# Patient Record
Sex: Male | Born: 1965
Health system: Southern US, Community
[De-identification: ages and names within clinical notes are randomized; demographics above are authoritative.]

## PROBLEM LIST (undated history)

## (undated) DIAGNOSIS — M25522 Pain in left elbow: Secondary | ICD-10-CM

## (undated) DIAGNOSIS — R140 Abdominal distension (gaseous): Secondary | ICD-10-CM

## (undated) DIAGNOSIS — K4090 Unilateral inguinal hernia, without obstruction or gangrene, not specified as recurrent: Secondary | ICD-10-CM

## (undated) DIAGNOSIS — R21 Rash and other nonspecific skin eruption: Secondary | ICD-10-CM

## (undated) DIAGNOSIS — F329 Major depressive disorder, single episode, unspecified: Secondary | ICD-10-CM

## (undated) DIAGNOSIS — B370 Candidal stomatitis: Secondary | ICD-10-CM

## (undated) DIAGNOSIS — M25561 Pain in right knee: Secondary | ICD-10-CM

## (undated) DIAGNOSIS — B20 Human immunodeficiency virus [HIV] disease: Secondary | ICD-10-CM

## (undated) DIAGNOSIS — Z21 Asymptomatic human immunodeficiency virus [HIV] infection status: Secondary | ICD-10-CM

## (undated) DIAGNOSIS — R1310 Dysphagia, unspecified: Secondary | ICD-10-CM

## (undated) DIAGNOSIS — B1920 Unspecified viral hepatitis C without hepatic coma: Secondary | ICD-10-CM

## (undated) DIAGNOSIS — N5089 Other specified disorders of the male genital organs: Secondary | ICD-10-CM

## (undated) HISTORY — DX: Abdominal distension (gaseous): R14.0

## (undated) HISTORY — DX: Unilateral inguinal hernia, without obstruction or gangrene, not specified as recurrent: K40.90

## (undated) HISTORY — DX: Human immunodeficiency virus (HIV) disease: B20

## (undated) HISTORY — DX: Candidal stomatitis: B37.0

## (undated) HISTORY — DX: Major depressive disorder, single episode, unspecified: F32.9

## (undated) HISTORY — DX: Rash and other nonspecific skin eruption: R21

## (undated) HISTORY — DX: Pain in right knee: M25.561

## (undated) HISTORY — DX: Dysphagia, unspecified: R13.10

---
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
## 2011-02-01 DIAGNOSIS — F1911 Other psychoactive substance abuse, in remission: Secondary | ICD-10-CM | POA: Insufficient documentation

## 2012-11-28 DIAGNOSIS — K602 Anal fissure, unspecified: Secondary | ICD-10-CM | POA: Insufficient documentation

## 2013-12-09 DIAGNOSIS — B20 Human immunodeficiency virus [HIV] disease: Secondary | ICD-10-CM | POA: Insufficient documentation

## 2014-05-01 DIAGNOSIS — Z8619 Personal history of other infectious and parasitic diseases: Secondary | ICD-10-CM | POA: Insufficient documentation

## 2014-10-29 DIAGNOSIS — R768 Other specified abnormal immunological findings in serum: Secondary | ICD-10-CM | POA: Insufficient documentation

## 2014-11-12 DIAGNOSIS — A563 Chlamydial infection of anus and rectum: Secondary | ICD-10-CM | POA: Insufficient documentation

## 2014-12-14 DIAGNOSIS — W540XXA Bitten by dog, initial encounter: Secondary | ICD-10-CM | POA: Insufficient documentation

## 2014-12-14 DIAGNOSIS — S81852A Open bite, left lower leg, initial encounter: Secondary | ICD-10-CM | POA: Insufficient documentation

## 2015-11-23 ENCOUNTER — Other Ambulatory Visit (INDEPENDENT_AMBULATORY_CARE_PROVIDER_SITE_OTHER): Payer: Self-pay

## 2015-11-23 DIAGNOSIS — Z79899 Other long term (current) drug therapy: Secondary | ICD-10-CM

## 2015-11-23 DIAGNOSIS — Z21 Asymptomatic human immunodeficiency virus [HIV] infection status: Secondary | ICD-10-CM

## 2015-11-23 DIAGNOSIS — Z113 Encounter for screening for infections with a predominantly sexual mode of transmission: Secondary | ICD-10-CM

## 2015-11-23 DIAGNOSIS — B20 Human immunodeficiency virus [HIV] disease: Secondary | ICD-10-CM

## 2015-11-23 LAB — CBC WITH DIFFERENTIAL/PLATELET
BASOS ABS: 0 {cells}/uL (ref 0–200)
BASOS PCT: 0 %
EOS PCT: 4 %
Eosinophils Absolute: 156 cells/uL (ref 15–500)
HCT: 41.3 % (ref 38.5–50.0)
Hemoglobin: 13.9 g/dL (ref 13.2–17.1)
Lymphocytes Relative: 29 %
Lymphs Abs: 1131 cells/uL (ref 850–3900)
MCH: 29.5 pg (ref 27.0–33.0)
MCHC: 33.7 g/dL (ref 32.0–36.0)
MCV: 87.7 fL (ref 80.0–100.0)
MONOS PCT: 19 %
MPV: 10.1 fL (ref 7.5–12.5)
Monocytes Absolute: 741 cells/uL (ref 200–950)
NEUTROS ABS: 1872 {cells}/uL (ref 1500–7800)
Neutrophils Relative %: 48 %
PLATELETS: 137 10*3/uL — AB (ref 140–400)
RBC: 4.71 MIL/uL (ref 4.20–5.80)
RDW: 13.9 % (ref 11.0–15.0)
WBC: 3.9 10*3/uL (ref 3.8–10.8)

## 2015-11-23 LAB — LIPID PANEL
CHOLESTEROL: 159 mg/dL (ref 125–200)
HDL: 51 mg/dL (ref >=40)
LDL Cholesterol: 75 mg/dL (ref <130)
Total CHOL/HDL Ratio: 3.1 Ratio (ref <=5.0)
Triglycerides: 165 mg/dL — ABNORMAL HIGH (ref <150)
VLDL: 33 mg/dL — ABNORMAL HIGH (ref <30)

## 2015-11-23 LAB — COMPLETE METABOLIC PANEL WITH GFR
ALT: 32 U/L (ref 9–46)
AST: 36 U/L — AB (ref 10–35)
Albumin: 3.5 g/dL — ABNORMAL LOW (ref 3.6–5.1)
Alkaline Phosphatase: 71 U/L (ref 40–115)
BILIRUBIN TOTAL: 0.4 mg/dL (ref 0.2–1.2)
BUN: 15 mg/dL (ref 7–25)
CO2: 24 mmol/L (ref 20–31)
Calcium: 9.2 mg/dL (ref 8.6–10.3)
Chloride: 106 mmol/L (ref 98–110)
Creat: 0.86 mg/dL (ref 0.70–1.33)
GFR, Est African American: >89 mL/min (ref >=60)
GLUCOSE: 67 mg/dL (ref 65–99)
POTASSIUM: 4 mmol/L (ref 3.5–5.3)
SODIUM: 139 mmol/L (ref 135–146)
TOTAL PROTEIN: 8 g/dL (ref 6.1–8.1)

## 2015-11-24 LAB — RPR TITER: RPR Titer: 1:1 {titer}

## 2015-11-24 LAB — URINALYSIS
BILIRUBIN URINE: NEGATIVE
GLUCOSE, UA: NEGATIVE
HGB URINE DIPSTICK: NEGATIVE
Ketones, ur: NEGATIVE
LEUKOCYTES UA: NEGATIVE
Nitrite: NEGATIVE
PROTEIN: NEGATIVE
Specific Gravity, Urine: 1.012 (ref 1.001–1.035)
pH: 7 (ref 5.0–8.0)

## 2015-11-24 LAB — HEPATITIS C ANTIBODY: HCV Ab: REACTIVE — AB

## 2015-11-24 LAB — RPR: RPR Ser Ql: REACTIVE — AB

## 2015-11-24 LAB — QUANTIFERON TB GOLD ASSAY (BLOOD)
Interferon Gamma Release Assay: NEGATIVE
MITOGEN-NIL SO: 6.07 [IU]/mL
Quantiferon Nil Value: 0.07 IU/mL

## 2015-11-24 LAB — HIV-1 RNA ULTRAQUANT REFLEX TO GENTYP+
HIV 1 RNA Quant: 168820 copies/mL — ABNORMAL HIGH (ref <20)
HIV-1 RNA QUANT, LOG: 5.23 {Log_copies}/mL — AB (ref <1.30)

## 2015-11-24 LAB — HEPATITIS B SURFACE ANTIGEN: Hepatitis B Surface Ag: NEGATIVE

## 2015-11-24 LAB — URINE CYTOLOGY ANCILLARY ONLY
CHLAMYDIA, DNA PROBE: NEGATIVE
NEISSERIA GONORRHEA: NEGATIVE

## 2015-11-24 LAB — HEPATITIS A ANTIBODY, TOTAL: HEP A TOTAL AB: NONREACTIVE

## 2015-11-24 LAB — HEPATITIS B CORE ANTIBODY, TOTAL: HEP B C TOTAL AB: REACTIVE — AB

## 2015-11-24 LAB — HEPATITIS C RNA QUANTITATIVE
HCV Quantitative Log: 7.1 {Log} — ABNORMAL HIGH (ref <1.18)
HCV Quantitative: 12455278 IU/mL — ABNORMAL HIGH (ref <15)

## 2015-11-24 LAB — HEPATITIS B SURFACE ANTIBODY,QUALITATIVE

## 2015-11-24 LAB — FLUORESCENT TREPONEMAL AB(FTA)-IGG-BLD: FLUORESCENT TREPONEMAL ABS: REACTIVE — AB

## 2015-11-24 LAB — T-HELPER CELL (CD4) - (RCID CLINIC ONLY)
CD4 T CELL ABS: 80 /uL — AB (ref 400–2700)
CD4 T CELL HELPER: 7 % — AB (ref 33–55)

## 2015-11-27 LAB — HLA B*5701: HLA-B*5701 w/rflx HLA-B High: NEGATIVE

## 2015-12-03 ENCOUNTER — Encounter: Payer: Self-pay | Admitting: Infectious Disease

## 2015-12-03 LAB — HIV-1 GENOTYPR PLUS

## 2015-12-07 ENCOUNTER — Ambulatory Visit: Payer: Self-pay | Admitting: Infectious Disease

## 2015-12-28 ENCOUNTER — Encounter: Payer: Self-pay | Admitting: Infectious Disease

## 2016-01-11 ENCOUNTER — Ambulatory Visit: Payer: Self-pay | Admitting: Infectious Disease

## 2016-01-18 ENCOUNTER — Encounter: Payer: Self-pay | Admitting: Infectious Disease

## 2016-01-18 ENCOUNTER — Ambulatory Visit: Payer: Self-pay | Admitting: *Deleted

## 2016-01-18 ENCOUNTER — Ambulatory Visit (INDEPENDENT_AMBULATORY_CARE_PROVIDER_SITE_OTHER): Payer: Self-pay | Admitting: Infectious Disease

## 2016-01-18 VITALS — Temp 97.9°F | Ht 68.0 in | Wt 151.0 lb

## 2016-01-18 DIAGNOSIS — A64 Unspecified sexually transmitted disease: Secondary | ICD-10-CM

## 2016-01-18 DIAGNOSIS — S81852A Open bite, left lower leg, initial encounter: Secondary | ICD-10-CM

## 2016-01-18 DIAGNOSIS — F32A Depression, unspecified: Secondary | ICD-10-CM

## 2016-01-18 DIAGNOSIS — Z23 Encounter for immunization: Secondary | ICD-10-CM

## 2016-01-18 DIAGNOSIS — W540XXA Bitten by dog, initial encounter: Secondary | ICD-10-CM

## 2016-01-18 DIAGNOSIS — K4091 Unilateral inguinal hernia, without obstruction or gangrene, recurrent: Secondary | ICD-10-CM

## 2016-01-18 DIAGNOSIS — B182 Chronic viral hepatitis C: Secondary | ICD-10-CM

## 2016-01-18 DIAGNOSIS — F331 Major depressive disorder, recurrent, moderate: Secondary | ICD-10-CM

## 2016-01-18 DIAGNOSIS — B20 Human immunodeficiency virus [HIV] disease: Secondary | ICD-10-CM

## 2016-01-18 DIAGNOSIS — F411 Generalized anxiety disorder: Secondary | ICD-10-CM

## 2016-01-18 DIAGNOSIS — F329 Major depressive disorder, single episode, unspecified: Secondary | ICD-10-CM | POA: Insufficient documentation

## 2016-01-18 DIAGNOSIS — R768 Other specified abnormal immunological findings in serum: Secondary | ICD-10-CM

## 2016-01-18 HISTORY — DX: Depression, unspecified: F32.A

## 2016-01-18 HISTORY — DX: Human immunodeficiency virus (HIV) disease: B20

## 2016-01-18 MED ORDER — CEFTRIAXONE SODIUM 250 MG IJ SOLR: 250.0000 mg | Freq: Once | INTRAMUSCULAR | 0 refills | Status: DC

## 2016-01-18 MED ORDER — DARUNAVIR-COBICISTAT 800-150 MG PO TABS: 1.0000 | ORAL_TABLET | Freq: Every day | ORAL | 11 refills | Status: DC

## 2016-01-18 MED ORDER — EMTRICITABINE-TENOFOVIR AF 200-25 MG PO TABS: 1.0000 | ORAL_TABLET | Freq: Every day | ORAL | 11 refills | Status: DC

## 2016-01-18 MED ORDER — AZITHROMYCIN 250 MG PO TABS
1000.0000 mg | ORAL_TABLET | Freq: Every day | ORAL | Status: DC
  Administered 2016-01-18: 1000 mg via ORAL

## 2016-01-18 MED ORDER — CEFTRIAXONE SODIUM 250 MG IJ SOLR
250.0000 mg | Freq: Once | INTRAMUSCULAR | Status: AC
  Administered 2016-01-18: 250 mg via INTRAMUSCULAR

## 2016-01-18 MED ORDER — SULFAMETHOXAZOLE-TRIMETHOPRIM 800-160 MG PO TABS: 1.0000 | ORAL_TABLET | Freq: Two times a day (BID) | ORAL | 11 refills | Status: DC

## 2016-01-18 NOTE — BH Specialist Note (Signed)
Counselor met with Jason [name] today in the exam room for a warm hand off.  Patient was oriented times four with nervous and fidgety affect.  Counselor introduce self and the counseling services available for him here at [place].  Patient was very talkative and had no problem sharing freely and openly.  Patient communicated that he was not doing so well and hadn't been since his partner died of cancer a couple of years ago.  Patient also shared that he does not have good family support.  Counselor provided support and encouragement for patient.  Patient indicated that he would like to make an appointment and better process through his loss and grief.  An appointment was made for the 17th of November.   Rolena Infante, MA, LPC [facility]

## 2016-01-18 NOTE — Patient Instructions (Signed)
Please meet with Pharmacy in next 2 weeks

## 2016-01-18 NOTE — Progress Notes (Signed)
Subjective:   Chief complaint: establish care for HIV, HCV, depression   Patient ID: Jason [name], male    DOB: [birth year], 50 y.o.   MRN: [id number]  HPI  50 year old Caucasian man with HIV, and HCV coinfection, Initially cared for in [place] at [facility]. [place] has some [place] [facility]. Unfortunately while there he has not been very adherent to his antiretroviral regimen of Prezista NORVIR and Truvada. He is off medicines now for several months and decided to change care to [facility]. Most recent viral load with is over 180,000 and his CD4 count is 80.  He is willing to go back on a protease inhibitor based regimen and were going to start him back on PREZCOBIX with DESCOVY and use Bactrim for PCP prophylaxis.  He has some significant depressive symptoms related to moving to the area where he has less support that he had a [place].  He also lost his job last year was bitten by a dog adverse other stressors that cause depression and cause him not to be as proactive about his health and adherent to his antiretrovirals.  I had him meet with Jason [name] today.   Past Medical History:  Diagnosis Date  . AIDS ([place]) 01/18/2016  . Depression 01/18/2016    No past surgical history on file.  No family history on file.    Social History   Social History  . Marital status: Single    Spouse name: N/A  . Number of children: N/A  . Years of education: N/A   Social History Main Topics  . Smoking status: Never Smoker  . Smokeless tobacco: Never Used  . Alcohol use Yes     Comment: rare  . Drug use:     Types: Marijuana     Comment: regular use  . Sexual activity: Yes    Partners: Male     Comment: pt. given condoms   Other Topics Concern  . None   Social History Narrative  . None    Allergies  Allergen Reactions  . Codeine Itching     Current Outpatient Prescriptions:  .   darunavir-cobicistat (PREZCOBIX) 800-150 MG tablet, Take 1 tablet by mouth daily., Disp: 30 tablet, Rfl: 11 .  emtricitabine-tenofovir AF (DESCOVY) 200-25 MG tablet, Take 1 tablet by mouth daily., Disp: 30 tablet, Rfl: 11 .  sulfamethoxazole-trimethoprim (BACTRIM DS,SEPTRA DS) 800-160 MG tablet, Take 1 tablet by mouth 2 (two) times daily., Disp: 30 tablet, Rfl: 11   Review of Systems  Constitutional: Negative for chills and fever.  HENT: Negative for congestion and sore throat.   Eyes: Negative for photophobia.  Respiratory: Negative for cough, shortness of breath and wheezing.   Cardiovascular: Negative for chest pain, palpitations and leg swelling.  Gastrointestinal: Negative for abdominal pain, blood in stool, constipation, diarrhea, nausea and vomiting.  Genitourinary: Negative for dysuria, flank pain and hematuria.  Musculoskeletal: Negative for back pain and myalgias.  Skin: Negative for rash.  Neurological: Negative for dizziness, weakness and headaches.  Hematological: Does not bruise/bleed easily.  Psychiatric/Behavioral: Positive for decreased concentration and dysphoric mood. Negative for self-injury, sleep disturbance and suicidal ideas. The patient is nervous/anxious.        Objective:   Physical Exam  Constitutional: He is oriented to person, place, and time. He appears well-developed and well-nourished. No distress.  HENT:  Head: Normocephalic and atraumatic.  Mouth/Throat: No oropharyngeal exudate.  Eyes: Conjunctivae  and EOM are normal. No scleral icterus.  Neck: Normal range of motion. Neck supple.  Cardiovascular: Normal rate and regular rhythm.   Pulmonary/Chest: Effort normal. No respiratory distress. He has no wheezes.  Abdominal: He exhibits no distension.  Musculoskeletal: He exhibits no edema or tenderness.  Neurological: He is alert and oriented to person, place, and time. He exhibits normal muscle tone. Coordination normal.  Skin: Skin is warm and dry. No  rash noted. He is not diaphoretic. No erythema. No pallor.  Psychiatric: His behavior is normal. Judgment and thought content normal. His mood appears anxious. He exhibits a depressed mood.          Assessment & Plan:   HIV/AIDS: He is approved for ADAP. Start PREZCOBIX and DESCOVY and start Bactrim for PCP prophylaxis bring back to see Pharmacy in 2 weeks, repeat labs in 4 weeks and visit with me in 6 weeks  Depression: met with Jason [name]:  Consider SSRI  Chronic hepatitis C without hepatic coma: see what labs were done at [facility] first and get his HIV controlled before proceeding  I spent greater than 60 minutes with the patient including greater than 50% of time in face to face counsel of the patient re his HIV, his new ARV regimen, his depression, his HCV and in coordination of his care.

## 2016-01-28 ENCOUNTER — Encounter: Payer: Self-pay | Admitting: *Deleted

## 2016-02-05 ENCOUNTER — Ambulatory Visit: Payer: Self-pay

## 2016-02-12 ENCOUNTER — Other Ambulatory Visit (INDEPENDENT_AMBULATORY_CARE_PROVIDER_SITE_OTHER): Payer: Self-pay

## 2016-02-12 ENCOUNTER — Ambulatory Visit: Payer: Self-pay | Admitting: *Deleted

## 2016-02-12 DIAGNOSIS — B20 Human immunodeficiency virus [HIV] disease: Secondary | ICD-10-CM

## 2016-02-12 DIAGNOSIS — F329 Major depressive disorder, single episode, unspecified: Secondary | ICD-10-CM

## 2016-02-12 DIAGNOSIS — Z23 Encounter for immunization: Secondary | ICD-10-CM

## 2016-02-12 DIAGNOSIS — F32A Depression, unspecified: Secondary | ICD-10-CM

## 2016-02-12 LAB — CBC WITH DIFFERENTIAL/PLATELET
BASOS ABS: 32 {cells}/uL (ref 0–200)
Basophils Relative: 1 %
EOS ABS: 160 {cells}/uL (ref 15–500)
Eosinophils Relative: 5 %
HEMATOCRIT: 43.2 % (ref 38.5–50.0)
HEMOGLOBIN: 14.7 g/dL (ref 13.2–17.1)
LYMPHS ABS: 1152 {cells}/uL (ref 850–3900)
Lymphocytes Relative: 36 %
MCH: 29.1 pg (ref 27.0–33.0)
MCHC: 34 g/dL (ref 32.0–36.0)
MCV: 85.5 fL (ref 80.0–100.0)
MONO ABS: 672 {cells}/uL (ref 200–950)
MPV: 11.1 fL (ref 7.5–12.5)
Monocytes Relative: 21 %
NEUTROS ABS: 1184 {cells}/uL — AB (ref 1500–7800)
NEUTROS PCT: 37 %
Platelets: 65 10*3/uL — ABNORMAL LOW (ref 140–400)
RBC: 5.05 MIL/uL (ref 4.20–5.80)
RDW: 14 % (ref 11.0–15.0)
WBC: 3.2 10*3/uL — ABNORMAL LOW (ref 3.8–10.8)

## 2016-02-12 LAB — COMPLETE METABOLIC PANEL WITH GFR
ALBUMIN: 3.8 g/dL (ref 3.6–5.1)
ALK PHOS: 76 U/L (ref 40–115)
ALT: 33 U/L (ref 9–46)
AST: 41 U/L — ABNORMAL HIGH (ref 10–35)
BILIRUBIN TOTAL: 0.7 mg/dL (ref 0.2–1.2)
BUN: 16 mg/dL (ref 7–25)
CALCIUM: 8.8 mg/dL (ref 8.6–10.3)
CO2: 22 mmol/L (ref 20–31)
Chloride: 104 mmol/L (ref 98–110)
Creat: 0.91 mg/dL (ref 0.70–1.33)
GFR, Est African American: >89 mL/min (ref >=60)
GLUCOSE: 86 mg/dL (ref 65–99)
POTASSIUM: 4.3 mmol/L (ref 3.5–5.3)
SODIUM: 133 mmol/L — AB (ref 135–146)
TOTAL PROTEIN: 8.3 g/dL — AB (ref 6.1–8.1)

## 2016-02-12 LAB — T-HELPER CELL (CD4) - (RCID CLINIC ONLY)
CD4 T CELL HELPER: 13 % — AB (ref 33–55)
CD4 T Cell Abs: 150 /uL — ABNORMAL LOW (ref 400–2700)

## 2016-02-12 NOTE — Progress Notes (Signed)
Jason [name] showed on time for his scheduled appointment with counselor today.  Patient was oriented times four but with nervous affect.  Patient bounced his legs throughout session and was restless in his seat. Patient was alert and talkative.  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 Counselor provided support and encouragement.  Counselor used reality therapy with patient to better cope with his unwise decision and the ramifications since.  Counselor educated patient that it would be recommended that he start with truly acknowledging his unwise decision, forgiving himself, and concentrating on the future.  Patient was able to follow these suggested steps and shared how he feels that he has good experiences to share with young people about the repercussion os doing drugs. Counselor encouraged patient to continue to attend counseling to further process better coping strategies for moving forward and healing. Patient made another appointment for two weeks out.   Jenel LucksJodi Armen Waring, MA, LPC [facility]

## 2016-02-19 ENCOUNTER — Other Ambulatory Visit: Payer: Self-pay

## 2016-02-24 LAB — HIV-1 RNA ULTRAQUANT REFLEX TO GENTYP+
HIV 1 RNA Quant: 4934 copies/mL — ABNORMAL HIGH (ref <20)
HIV-1 RNA QUANT, LOG: 3.69 {Log_copies}/mL — AB (ref <1.30)

## 2016-02-29 ENCOUNTER — Telehealth: Payer: Self-pay | Admitting: *Deleted

## 2016-02-29 NOTE — Telephone Encounter (Signed)
-----   Message from Cornelius N Van Dam, MD sent at 02/29/2016  8:43 AM EST ----- I need Karen to pull up the genotyope and print since done by [facility] 

## 2016-02-29 NOTE — Telephone Encounter (Signed)
Request given to karen. Melanee Cordial M, RN  

## 2016-03-03 ENCOUNTER — Encounter: Payer: Self-pay | Admitting: Infectious Disease

## 2016-03-03 ENCOUNTER — Other Ambulatory Visit: Payer: Self-pay

## 2016-03-03 ENCOUNTER — Ambulatory Visit (INDEPENDENT_AMBULATORY_CARE_PROVIDER_SITE_OTHER): Payer: Self-pay | Admitting: Infectious Disease

## 2016-03-03 VITALS — BP 151/88 | HR 70 | Temp 97.7°F | Wt 155.1 lb

## 2016-03-03 DIAGNOSIS — B20 Human immunodeficiency virus [HIV] disease: Secondary | ICD-10-CM

## 2016-03-03 DIAGNOSIS — F32A Depression, unspecified: Secondary | ICD-10-CM

## 2016-03-03 DIAGNOSIS — F329 Major depressive disorder, single episode, unspecified: Secondary | ICD-10-CM

## 2016-03-03 DIAGNOSIS — B370 Candidal stomatitis: Secondary | ICD-10-CM

## 2016-03-03 DIAGNOSIS — Z23 Encounter for immunization: Secondary | ICD-10-CM

## 2016-03-03 DIAGNOSIS — Z113 Encounter for screening for infections with a predominantly sexual mode of transmission: Secondary | ICD-10-CM

## 2016-03-03 DIAGNOSIS — R21 Rash and other nonspecific skin eruption: Secondary | ICD-10-CM

## 2016-03-03 HISTORY — DX: Rash and other nonspecific skin eruption: R21

## 2016-03-03 HISTORY — DX: Candidal stomatitis: B37.0

## 2016-03-03 LAB — CBC WITH DIFFERENTIAL/PLATELET
BASOS PCT: 0 %
Basophils Absolute: 0 cells/uL (ref 0–200)
EOS PCT: 2 %
Eosinophils Absolute: 94 cells/uL (ref 15–500)
HEMATOCRIT: 43.3 % (ref 38.5–50.0)
Hemoglobin: 14.3 g/dL (ref 13.2–17.1)
LYMPHS PCT: 42 %
Lymphs Abs: 1974 cells/uL (ref 850–3900)
MCH: 29.5 pg (ref 27.0–33.0)
MCHC: 33 g/dL (ref 32.0–36.0)
MCV: 89.3 fL (ref 80.0–100.0)
MONOS PCT: 17 %
MPV: 10.7 fL (ref 7.5–12.5)
Monocytes Absolute: 799 cells/uL (ref 200–950)
NEUTROS PCT: 39 %
Neutro Abs: 1833 cells/uL (ref 1500–7800)
PLATELETS: 144 10*3/uL (ref 140–400)
RBC: 4.85 MIL/uL (ref 4.20–5.80)
RDW: 14.5 % (ref 11.0–15.0)
WBC: 4.7 10*3/uL (ref 3.8–10.8)

## 2016-03-03 LAB — COMPLETE METABOLIC PANEL WITH GFR
ALBUMIN: 3.9 g/dL (ref 3.6–5.1)
ALK PHOS: 76 U/L (ref 40–115)
ALT: 48 U/L — AB (ref 9–46)
AST: 55 U/L — AB (ref 10–35)
BILIRUBIN TOTAL: 0.5 mg/dL (ref 0.2–1.2)
BUN: 18 mg/dL (ref 7–25)
CALCIUM: 8.9 mg/dL (ref 8.6–10.3)
CHLORIDE: 103 mmol/L (ref 98–110)
CO2: 25 mmol/L (ref 20–31)
CREATININE: 1.01 mg/dL (ref 0.70–1.33)
GFR, Est Non African American: 86 mL/min (ref >=60)
Glucose, Bld: 98 mg/dL (ref 65–99)
Potassium: 3.9 mmol/L (ref 3.5–5.3)
Sodium: 136 mmol/L (ref 135–146)
TOTAL PROTEIN: 8.4 g/dL — AB (ref 6.1–8.1)

## 2016-03-03 LAB — HIV-1 GENOTYPR PLUS

## 2016-03-03 MED ORDER — FLUCONAZOLE 100 MG PO TABS: 100.0000 mg | ORAL_TABLET | Freq: Every day | ORAL | 0 refills | Status: AC

## 2016-03-03 NOTE — Progress Notes (Signed)
Subjective:   Chief complaint: Shoulder rash and concern that he might have thrush as well   Patient ID: Jason [name], male    DOB: [birth year], 50 y.o.   MRN: [id number]  HPI  50 year old Caucasian man with HIV, and HCV coinfection, Initially cared for in [place] at [facility]. He has some [place] and [place] [facility]. Unfortunately while there he has not been very adherent to his antiretroviral regimen of Prezista NORVIR and Truvada. He is off medicines now for several months and decided to change care to [facility]. Most recent viral load with is over 180,000 and his CD4 count is 80.  He is willing to go back on a protease inhibitor based regimen and were going to start him back on PREZCOBIX with DESCOVY and use Bactrim for PCP prophylaxis.  Since starting his regimen his viral load dropped into the 4000 range and CD4 count went up to 150. He claims to be highly adherent to his antiretroviral regimen. He stated he had a rash on his face before the burns intently especially if he sweats or if he shaves with a razor. This was present before initiation of his antiretrovirals worsened since then.  He also is concerned he might have some thrush because he has some dysphasia but could not see visible thrush on exam of his oropharynx.   Past Medical History:  Diagnosis Date  . AIDS ([facility]) 01/18/2016  . Depression 01/18/2016    No past surgical history on file.  No family history on file.    Social History   Social History  . Marital status: Single    Spouse name: N/A  . Number of children: N/A  . Years of education: N/A   Social History Main Topics  . Smoking status: Never Smoker  . Smokeless tobacco: Never Used  . Alcohol use Yes     Comment: rare  . Drug use:     Types: Marijuana     Comment: regular use  . Sexual activity: Yes    Partners: Male     Comment: pt. given condoms   Other Topics  Concern  . None   Social History Narrative  . None    Allergies  Allergen Reactions  . Codeine Itching     Current Outpatient Prescriptions:  .  darunavir-cobicistat (PREZCOBIX) 800-150 MG tablet, Take 1 tablet by mouth daily., Disp: 30 tablet, Rfl: 11 .  emtricitabine-tenofovir AF (DESCOVY) 200-25 MG tablet, Take 1 tablet by mouth daily., Disp: 30 tablet, Rfl: 11 .  sulfamethoxazole-trimethoprim (BACTRIM DS,SEPTRA DS) 800-160 MG tablet, Take 1 tablet by mouth 2 (two) times daily., Disp: 30 tablet, Rfl: 11   Review of Systems  Constitutional: Negative for chills and fever.  HENT: Negative for congestion and sore throat.   Eyes: Negative for photophobia.  Respiratory: Negative for cough, shortness of breath and wheezing.   Cardiovascular: Negative for chest pain, palpitations and leg swelling.  Gastrointestinal: Negative for abdominal pain, blood in stool, constipation, diarrhea, nausea and vomiting.  Genitourinary: Negative for dysuria, flank pain and hematuria.  Musculoskeletal: Negative for back pain and myalgias.  Skin: Negative for rash.  Neurological: Negative for dizziness, weakness and headaches.  Hematological: Does not bruise/bleed easily.  Psychiatric/Behavioral: Positive for decreased concentration and dysphoric mood. Negative for self-injury, sleep disturbance and suicidal ideas. The patient is nervous/anxious.        Objective:   Physical Exam  Constitutional:  He is oriented to person, place, and time. He appears well-developed and well-nourished. No distress.  HENT:  Head: Normocephalic and atraumatic.  Mouth/Throat: No oropharyngeal exudate.  Eyes: Conjunctivae and EOM are normal. No scleral icterus.  Neck: Normal range of motion. Neck supple.  Cardiovascular: Normal rate and regular rhythm.   Pulmonary/Chest: Effort normal. No respiratory distress. He has no wheezes.  Abdominal: He exhibits no distension.  Musculoskeletal: He exhibits no edema or  tenderness.  Neurological: He is alert and oriented to person, place, and time. He exhibits normal muscle tone. Coordination normal.  Skin: Skin is warm and dry. No rash noted. He is not diaphoretic. No erythema. No pallor.  Psychiatric: His behavior is normal. Judgment and thought content normal. His mood appears anxious. He exhibits a depressed mood.          Assessment & Plan:   HIV/AIDS: He is approved for ADAP. Start PREZCOBIX and DESCOVY and continue Bactrim for PCP prophylaxis, recheck labs today  Depression: Made to need an SSRI  Facial rash: Prior week steroid such as over-the-counter hydrocortisone be more careful with shaving use more gentle shaving cream.  Aphasia with possible thrush I could not see thrush in his oropharynx but did see some possible aphthous ulcers on his underside of his tongue L given fluconazole for 14 days. Improves his symptoms.  Chronic hepatitis C without hepatic coma: see what labs were done at [facility] first and get his HIV controlled before proceeding  I spent greater than 25 minutes with the patient including greater than 50% of time in face to face counsel of the patient re his HIV, his new ARV regimen, his depression, his HCV facial rash and possible thrush and in coordination of his care.

## 2016-03-04 LAB — T-HELPER CELL (CD4) - (RCID CLINIC ONLY)
CD4 % Helper T Cell: 14 % — ABNORMAL LOW (ref 33–55)
CD4 T Cell Abs: 300 /uL — ABNORMAL LOW (ref 400–2700)

## 2016-03-04 LAB — FLUORESCENT TREPONEMAL AB(FTA)-IGG-BLD: Fluorescent Treponemal ABS: REACTIVE — AB

## 2016-03-04 LAB — RPR: RPR Ser Ql: REACTIVE — AB

## 2016-03-04 LAB — RPR TITER: RPR Titer: 1:1 {titer}

## 2016-03-08 LAB — HIV RNA, RTPCR W/R GT (RTI, PI,INT)
HIV-1 RNA, QN PCR: 1580 copies/mL — ABNORMAL HIGH
HIV-1 RNA, QN PCR: 3.2 {Log_copies}/mL — AB

## 2016-03-13 LAB — RFLX HIV-1 INTEGRASE GENOTYPE

## 2016-03-15 LAB — HIV-1 GENOTYPE: HIV-1 Genotype: DETECTED — AB

## 2016-04-01 ENCOUNTER — Ambulatory Visit: Payer: Self-pay | Admitting: *Deleted

## 2016-04-07 ENCOUNTER — Other Ambulatory Visit: Payer: Self-pay | Admitting: Infectious Disease

## 2016-04-07 DIAGNOSIS — B20 Human immunodeficiency virus [HIV] disease: Secondary | ICD-10-CM

## 2016-04-11 ENCOUNTER — Other Ambulatory Visit: Payer: Self-pay | Admitting: Infectious Disease

## 2016-04-11 DIAGNOSIS — B20 Human immunodeficiency virus [HIV] disease: Secondary | ICD-10-CM

## 2016-04-14 ENCOUNTER — Ambulatory Visit: Payer: Self-pay | Admitting: Infectious Disease

## 2016-05-26 ENCOUNTER — Ambulatory Visit: Payer: Self-pay | Admitting: Infectious Disease

## 2016-06-08 ENCOUNTER — Ambulatory Visit: Payer: Self-pay | Admitting: Infectious Disease

## 2016-06-15 ENCOUNTER — Encounter (HOSPITAL_BASED_OUTPATIENT_CLINIC_OR_DEPARTMENT_OTHER): Payer: Self-pay

## 2016-06-15 ENCOUNTER — Emergency Department (HOSPITAL_BASED_OUTPATIENT_CLINIC_OR_DEPARTMENT_OTHER)
Admission: EM | Admit: 2016-06-15 | Discharge: 2016-06-15 | Disposition: A | Discharge status: Home / Self Care | Payer: Self-pay | Attending: Emergency Medicine | Admitting: Emergency Medicine

## 2016-06-15 ENCOUNTER — Emergency Department (HOSPITAL_BASED_OUTPATIENT_CLINIC_OR_DEPARTMENT_OTHER): Payer: Self-pay

## 2016-06-15 ENCOUNTER — Telehealth: Payer: Self-pay | Admitting: *Deleted

## 2016-06-15 DIAGNOSIS — M25561 Pain in right knee: Secondary | ICD-10-CM

## 2016-06-15 DIAGNOSIS — B20 Human immunodeficiency virus [HIV] disease: Secondary | ICD-10-CM | POA: Insufficient documentation

## 2016-06-15 DIAGNOSIS — M7121 Synovial cyst of popliteal space [Baker], right knee: Secondary | ICD-10-CM

## 2016-06-15 HISTORY — DX: Unspecified viral hepatitis C without hepatic coma: B19.20

## 2016-06-15 HISTORY — DX: Asymptomatic human immunodeficiency virus (hiv) infection status: Z21

## 2016-06-15 MED ORDER — ORPHENADRINE CITRATE ER 100 MG PO TB12: 100.0000 mg | ORAL_TABLET | Freq: Two times a day (BID) | ORAL | 0 refills | Status: DC

## 2016-06-15 MED ORDER — TRAMADOL HCL 50 MG PO TABS: 50.0000 mg | ORAL_TABLET | Freq: Four times a day (QID) | ORAL | 0 refills | Status: DC | PRN

## 2016-06-15 NOTE — ED Provider Notes (Signed)
[facility]-EM[facility]GENCY DEPT [facility] Provider Note   CSN: [id number] Arrival date & time: 06/15/16  1628     History   Chief Complaint Chief Complaint  Patient presents with  . Leg Pain    HPI Jason [name] is a 51 y.o. male.  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 He reports that the time he didn't have specifically a lot of pain and he didn't pull muscle. He reports however he's been getting a lot of discomfort now over the past couple of days behind his knee and radiating down into his calf and up the back of his leg. He reports it hasn't specifically looked swollen. He is able to walk on it. It is tight and very uncomfortable if he bends his knee very far. No other associated symptoms. Past Medical History:  Diagnosis Date  . AIDS ([facility]) 01/18/2016  . Depression 01/18/2016  . Facial rash 03/03/2016  . Hepatitis C   . HIV positive (HCC)   . Thrush 03/03/2016    Patient Active Problem List   Diagnosis Date Noted  . Facial rash 03/03/2016  . Thrush 03/03/2016  . AIDS (HCC) 01/18/2016  . Depression 01/18/2016  . Dog bite of left calf 12/14/2014  . 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 Chronic hepatitis C without hepatic coma (HCC) 05/01/2014  . Human immunodeficiency virus (HIV) disease (HCC) 12/09/2013    History reviewed. No pertinent surgical history.     Home Medications    Prior to Admission medications   Medication Sig Start Date End Date Taking? Authorizing Provider  darunavir-cobicistat (PREZCOBIX) 800-150 MG tablet Take 1 tablet by mouth daily. 01/18/16   Randall Hissornelius N Van Dam, MD  emtricitabine-tenofovir AF (DESCOVY) 200-25 MG tablet Take 1 tablet by mouth daily. 01/18/16   Randall Hissornelius N Van Dam, MD  orphenadrine (NORFLEX) 100 MG tablet Take 1 tablet (100 mg total) by mouth 2 (two) times daily. 06/15/16   Arby BarretteMarcy Cariana Karge, MD  sulfamethoxazole-trimethoprim (BACTRIM  DS,SEPTRA DS) 800-160 MG tablet Take 1 tablet by mouth 2 (two) times daily. 01/18/16   Randall Hissornelius N Van Dam, MD  traMADol (ULTRAM) 50 MG tablet Take 1 tablet (50 mg total) by mouth every 6 (six) hours as needed. 06/15/16   Arby BarretteMarcy Yeilin Zweber, MD    Family History No family history on file.  Social History Social History  Substance Use Topics  . Smoking status: Never Smoker  . Smokeless tobacco: Never Used  . Alcohol use Yes     Comment: rare     Allergies   Codeine   Review of Systems Review of Systems Constitutional: No fever no chills, malaise Respiratory: Recent URI with cough but no chest pain or dyspnea or lightheadedness or near syncope.  Physical Exam Updated Vital Signs BP 125/79 (BP Location: Left Arm)   Pulse 78   Temp 97.6 F (36.4 C) (Oral)   Resp 18   Ht 5\' 8"  (1.727 m)   Wt 155 lb (70.3 kg)   SpO2 99%   BMI 23.57 kg/m   Physical Exam  Constitutional: He is oriented to person, place, and time. He appears well-developed and well-nourished.  HENT:  Head: Normocephalic and atraumatic.  Eyes: Conjunctivae are normal.  Neck: Neck supple.  Cardiovascular: Normal rate and regular rhythm.   No murmur heard. Pulmonary/Chest: Effort normal and breath sounds normal. No respiratory distress.  Abdominal: Soft. There is no tenderness.  Musculoskeletal: He exhibits no edema.  Symmetric  appearing lower extremities. No peripheral edema. Calves are soft. Discomfort to palpation in the popliteal fossa in the right lower leg in the calf muscle. Dorsalis pedis pulses 2+ and symmetric. No joint effusion or erythema  Neurological: He is alert and oriented to person, place, and time. No cranial nerve deficit. He exhibits normal muscle tone. Coordination normal.  Skin: Skin is warm and dry.  Psychiatric: He has a normal mood and affect.  Nursing note and vitals reviewed.    ED Treatments / Results  Labs (all labs ordered are listed, but only abnormal results are  displayed) Labs Reviewed - No data to display  EKG  EKG Interpretation None       Radiology US Venous Img Lower Unilateral Right  Result Date: 06/15/2016 CLINICAL DATA:  Pain. EXAM: Right LOWER EXTREMITY VENOUS DOPPLER ULTRASOUND TECHNIQUE: Gray-scale sonography with graded compression, as well as color Doppler and duplex ultrasound were performed to evaluate the lower extremity deep venous systems from the level of the common femoral vein and including the common femoral, femoral, profunda femoral, popliteal and calf veins including the posterior tibial, peroneal and gastrocnemius veins when visible. The superficial great saphenous vein was also interrogated. Spectral Doppler was utilized to evaluate flow at rest and with distal augmentation maneuvers in the common femoral, femoral and popliteal veins. COMPARISON:  None. FINDINGS: Contralateral Common Femoral Vein: Respiratory phasicity is normal and symmetric with the symptomatic side. No evidence of thrombus. Normal compressibility. Common Femoral Vein: No evidence of thrombus. Normal compressibility, respiratory phasicity and response to augmentation. Saphenofemoral Junction: No evidence of thrombus. Normal compressibility and flow on color Doppler imaging. Profunda Femoral Vein: No evidence of thrombus. Normal compressibility and flow on color Doppler imaging. Femoral Vein: No evidence of thrombus. Normal compressibility, respiratory phasicity and response to augmentation. Popliteal Vein: No evidence of thrombus. Normal compressibility, respiratory phasicity and response to augmentation. Calf Veins: No evidence of thrombus. Normal compressibility and flow on color Doppler imaging. Superficial Great Saphenous Vein: No evidence of thrombus. Normal compressibility and flow on color Doppler imaging. Venous Reflux:  None. Other Findings: Complex cystic structure involving the right lateral knee extending superiorly into the superior aspect of the calf  muscles noted. This measures 5.3 x 1.7 x 2.3 cm. IMPRESSION: 1. No evidence for deep vein thrombosis. 2. Complex cyst associated with the lateral aspect of the right knee. Nonspecific. This may represent a synovial cyst or parameniscal cyst. Further investigation with nonemergent MRI of the knee may be obtained. Electronically Signed   By: Signa Kell M.D.   On: 06/15/2016 18:24    Procedures Procedures (including critical care time)  Medications Ordered in ED Medications - No data to display   Initial Impression / Assessment and Plan / ED Course  I have reviewed the triage vital signs and the nursing notes.  Pertinent labs & imaging results that were available during my care of the patient were reviewed by me and considered in my medical decision making (see chart for details).      Final Clinical Impressions(s) / ED Diagnoses   Final diagnoses:  Acute pain of right knee  Synovial cyst of knee, right  DVT study negative. Findings consistent with intra-articular joint pain.no  Signs of infection or septic knee. Patient will follow up with sports medicine. Norflex and tramadol for pain. He is instructed in Ace wrap, elevate and icing.  New Prescriptions New Prescriptions   ORPHENADRINE (NORFLEX) 100 MG TABLET  Take 1 tablet (100 mg total) by mouth 2 (two) times daily.   TRAMADOL (ULTRAM) 50 MG TABLET    Take 1 tablet (50 mg total) by mouth every 6 (six) hours as needed.     Arby Barrette, MD 06/15/16 [phone]

## 2016-06-15 NOTE — Telephone Encounter (Signed)
Very good. He should get doppler to rule out DVT

## 2016-06-15 NOTE — Telephone Encounter (Signed)
Increasing pain in right calf over the past week.  No redness, warmth or edema per the patient.  Patient does not currently have insurance only [employer] coverage.  No [facility] MD appointments available in the next 2 weeks.  RN advised the patient to go the [facility] for evaluation.  RN advised the patient to ask to talk with a [facility] Financial Advisor about the [facility] Discount Program and about a payment plan.  The patient verbalized understanding and agreed to go to [facility].

## 2016-06-15 NOTE — ED Triage Notes (Signed)
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

## 2016-06-22 ENCOUNTER — Encounter: Payer: Self-pay | Admitting: Infectious Disease

## 2016-07-04 ENCOUNTER — Other Ambulatory Visit: Payer: Self-pay

## 2016-07-04 ENCOUNTER — Ambulatory Visit: Payer: Self-pay

## 2016-07-06 ENCOUNTER — Ambulatory Visit: Payer: Self-pay

## 2016-07-06 ENCOUNTER — Other Ambulatory Visit (INDEPENDENT_AMBULATORY_CARE_PROVIDER_SITE_OTHER): Payer: Self-pay

## 2016-07-06 DIAGNOSIS — B20 Human immunodeficiency virus [HIV] disease: Secondary | ICD-10-CM

## 2016-07-06 DIAGNOSIS — Z113 Encounter for screening for infections with a predominantly sexual mode of transmission: Secondary | ICD-10-CM

## 2016-07-06 LAB — CBC WITH DIFFERENTIAL/PLATELET
BASOS ABS: 0 {cells}/uL (ref 0–200)
Basophils Relative: 0 %
EOS PCT: 1 %
Eosinophils Absolute: 51 cells/uL (ref 15–500)
HCT: 41.6 % (ref 38.5–50.0)
Hemoglobin: 13.7 g/dL (ref 13.2–17.1)
LYMPHS ABS: 1020 {cells}/uL (ref 850–3900)
Lymphocytes Relative: 20 %
MCH: 30 pg (ref 27.0–33.0)
MCHC: 32.9 g/dL (ref 32.0–36.0)
MCV: 91.2 fL (ref 80.0–100.0)
MONOS PCT: 13 %
MPV: 9.2 fL (ref 7.5–12.5)
Monocytes Absolute: 663 cells/uL (ref 200–950)
NEUTROS PCT: 66 %
Neutro Abs: 3366 cells/uL (ref 1500–7800)
PLATELETS: 312 10*3/uL (ref 140–400)
RBC: 4.56 MIL/uL (ref 4.20–5.80)
RDW: 14.4 % (ref 11.0–15.0)
WBC: 5.1 10*3/uL (ref 3.8–10.8)

## 2016-07-06 LAB — COMPLETE METABOLIC PANEL WITHOUT GFR
ALT: 61 U/L — ABNORMAL HIGH (ref 9–46)
AST: 42 U/L — ABNORMAL HIGH (ref 10–35)
Albumin: 3.4 g/dL — ABNORMAL LOW (ref 3.6–5.1)
Alkaline Phosphatase: 70 U/L (ref 40–115)
BUN: 10 mg/dL (ref 7–25)
CO2: 19 mmol/L — ABNORMAL LOW (ref 20–31)
Calcium: 8.8 mg/dL (ref 8.6–10.3)
Chloride: 107 mmol/L (ref 98–110)
Creat: 0.91 mg/dL (ref 0.70–1.33)
GFR, Est African American: >89 mL/min
GFR, Est Non African American: >89 mL/min
Glucose, Bld: 102 mg/dL — ABNORMAL HIGH (ref 65–99)
Potassium: 4 mmol/L (ref 3.5–5.3)
Sodium: 140 mmol/L (ref 135–146)
Total Bilirubin: 0.5 mg/dL (ref 0.2–1.2)
Total Protein: 7.2 g/dL (ref 6.1–8.1)

## 2016-07-07 LAB — T-HELPER CELL (CD4) - (RCID CLINIC ONLY)
CD4 % Helper T Cell: 15 % — ABNORMAL LOW (ref 33–55)
CD4 T Cell Abs: 180 /uL — ABNORMAL LOW (ref 400–2700)

## 2016-07-07 LAB — FLUORESCENT TREPONEMAL AB(FTA)-IGG-BLD: FLUORESCENT TREPONEMAL ABS: REACTIVE — AB

## 2016-07-08 LAB — HIV-1 RNA QUANT-NO REFLEX-BLD
HIV 1 RNA Quant: 74 {copies}/mL — ABNORMAL HIGH
HIV-1 RNA Quant, Log: 1.87 {Log_copies}/mL — ABNORMAL HIGH

## 2016-07-09 LAB — RPR TITER: RPR Titer: 1:32 {titer} — AB

## 2016-07-09 LAB — RPR: RPR Ser Ql: REACTIVE — AB

## 2016-07-11 ENCOUNTER — Telehealth: Payer: Self-pay | Admitting: *Deleted

## 2016-07-11 NOTE — Telephone Encounter (Signed)
Positive lab results requiring treatment.  Requested the patient call in the morning to discuss results and treatment as below per [name].

## 2016-07-11 NOTE — Telephone Encounter (Signed)
-----   Message from Randall Hiss, MD sent at 07/09/2016  8:36 AM EDT ----- Patient with RPR to 1:32 needs PCN 2.4 MU IM weekly x 3 weeks

## 2016-07-12 ENCOUNTER — Ambulatory Visit (INDEPENDENT_AMBULATORY_CARE_PROVIDER_SITE_OTHER): Payer: Self-pay | Admitting: *Deleted

## 2016-07-12 DIAGNOSIS — A539 Syphilis, unspecified: Secondary | ICD-10-CM

## 2016-07-12 MED ORDER — PENICILLIN G BENZATHINE 1200000 UNIT/2ML IM SUSP
1.2000 10*6.[IU] | Freq: Once | INTRAMUSCULAR | Status: AC
  Administered 2016-07-12: 1.2 10*6.[IU] via INTRAMUSCULAR

## 2016-07-19 ENCOUNTER — Ambulatory Visit (INDEPENDENT_AMBULATORY_CARE_PROVIDER_SITE_OTHER): Payer: Self-pay | Admitting: Infectious Disease

## 2016-07-19 ENCOUNTER — Encounter: Payer: Self-pay | Admitting: Infectious Disease

## 2016-07-19 VITALS — BP 119/77 | HR 68 | Temp 99.3°F | Ht 68.0 in | Wt 159.0 lb

## 2016-07-19 DIAGNOSIS — R1310 Dysphagia, unspecified: Secondary | ICD-10-CM | POA: Insufficient documentation

## 2016-07-19 DIAGNOSIS — Z79899 Other long term (current) drug therapy: Secondary | ICD-10-CM

## 2016-07-19 DIAGNOSIS — B20 Human immunodeficiency virus [HIV] disease: Secondary | ICD-10-CM

## 2016-07-19 DIAGNOSIS — R1312 Dysphagia, oropharyngeal phase: Secondary | ICD-10-CM

## 2016-07-19 DIAGNOSIS — A539 Syphilis, unspecified: Secondary | ICD-10-CM

## 2016-07-19 DIAGNOSIS — Z113 Encounter for screening for infections with a predominantly sexual mode of transmission: Secondary | ICD-10-CM

## 2016-07-19 HISTORY — DX: Dysphagia, unspecified: R13.10

## 2016-07-19 MED ORDER — PENICILLIN G BENZATHINE 1200000 UNIT/2ML IM SUSP
1.2000 10*6.[IU] | Freq: Once | INTRAMUSCULAR | Status: AC
  Administered 2016-07-19: 1.2 10*6.[IU] via INTRAMUSCULAR

## 2016-07-19 MED ORDER — FLUCONAZOLE 100 MG PO TABS: 100.0000 mg | ORAL_TABLET | Freq: Every day | ORAL | 0 refills | Status: DC

## 2016-07-19 NOTE — Patient Instructions (Signed)
Make sure you do labs in July and RENEW ADAP then, appt with [name] in August

## 2016-07-19 NOTE — Progress Notes (Signed)
Subjective:   Chief complaint: concern for thrush and re his recent syphilis   Patient ID: Jason [name], male    DOB: 06-24-1965, 51 y.o.   MRN: [id number]  HPI  51 year old Caucasian man with HIV, and HCV coinfection, Initially cared for in [place] at [place]. He has some [place] and [place] care [facility]. Unfortunately while there he has not been very adherent to his antiretroviral regimen of Prezista NORVIR and Truvada. He was off medicines for several months and decided to change care to [facility]. Most recent viral load with is over 180,000 and his CD4 count is 80.  We put him back on a protease inhibitor based regimen and were going to stared  him back on PREZCOBIX with DESCOVY and use Bactrim for PCP prophylaxis.  Lab Results  Component Value Date   HIV1RNAQUANT 74 (H) 07/06/2016   HIV1RNAQUANT 4,934 (H) 02/12/2016   HIV1RNAQUANT 027,253 (H) 11/23/2015   Lab Results  Component Value Date   CD4TABS 180 (L) 07/06/2016   CD4TABS 300 (L) 03/03/2016   CD4TABS 150 (L) 02/12/2016    He had RPR go up to 1:32 and is in midst of receiving PCN. He endorses dysphagia and has taste of "thrush in my mouth."    Past Medical History:  Diagnosis Date  . AIDS (HCC) 01/18/2016  . Depression 01/18/2016  . Dysphagia 07/19/2016  . Facial rash 03/03/2016  . Hepatitis C   . HIV positive (HCC)   . Thrush 03/03/2016    No past surgical history on file.  No family history on file.    Social History   Social History  . Marital status: Single    Spouse name: N/A  . Number of children: N/A  . Years of education: N/A   Social History Main Topics  . Smoking status: Never Smoker  . Smokeless tobacco: Never Used  . Alcohol use No     Comment: rare  . Drug use: Yes    Types: Marijuana  . Sexual activity: Not Asked   Other Topics Concern  . None   Social History Narrative  . None    Allergies  Allergen  Reactions  . Codeine Itching     Current Outpatient Prescriptions:  .  darunavir-cobicistat (PREZCOBIX) 800-150 MG tablet, Take 1 tablet by mouth daily., Disp: 30 tablet, Rfl: 11 .  emtricitabine-tenofovir AF (DESCOVY) 200-25 MG tablet, Take 1 tablet by mouth daily., Disp: 30 tablet, Rfl: 11 .  sulfamethoxazole-trimethoprim (BACTRIM DS,SEPTRA DS) 800-160 MG tablet, Take 1 tablet by mouth 2 (two) times daily., Disp: 30 tablet, Rfl: 11 .  fluconazole (DIFLUCAN) 100 MG tablet, Take 1 tablet (100 mg total) by mouth daily., Disp: 14 tablet, Rfl: 0   Review of Systems  Constitutional: Negative for chills and fever.  HENT: Positive for sore throat and trouble swallowing. Negative for congestion.   Eyes: Negative for photophobia.  Respiratory: Negative for cough, shortness of breath and wheezing.   Cardiovascular: Negative for chest pain, palpitations and leg swelling.  Gastrointestinal: Negative for abdominal pain, blood in stool, constipation, diarrhea, nausea and vomiting.  Genitourinary: Negative for dysuria, flank pain and hematuria.  Musculoskeletal: Negative for back pain and myalgias.  Skin: Positive for rash.  Neurological: Negative for dizziness, weakness and headaches.  Hematological: Does not bruise/bleed easily.  Psychiatric/Behavioral: Negative for decreased concentration, dysphoric mood, self-injury, sleep disturbance and suicidal ideas. The patient is not nervous/anxious.  Objective:   Physical Exam  Constitutional: He is oriented to person, place, and time. He appears well-developed and well-nourished. No distress.  HENT:  Head: Normocephalic and atraumatic.  Mouth/Throat: Uvula is midline and oropharynx is clear and moist. No oropharyngeal exudate.  Eyes: Conjunctivae and EOM are normal. No scleral icterus.  Neck: Normal range of motion. Neck supple.  Cardiovascular: Normal rate and regular rhythm.   Pulmonary/Chest: Effort normal. No respiratory distress. He has  no wheezes.  Abdominal: He exhibits no distension.  Musculoskeletal: He exhibits no edema or tenderness.  Neurological: He is alert and oriented to person, place, and time. He exhibits normal muscle tone. Coordination normal.  Skin: Skin is warm and dry. No rash noted. He is not diaphoretic. No erythema. No pallor.     Psychiatric: His behavior is normal. Judgment and thought content normal. His mood appears anxious. He exhibits a depressed mood.          Assessment & Plan:   HIV/AIDS: continue PREZCOBIX and DESCOVY and continue Bactrim for PCP prophylaxis, recheck labs today   Facial rash:had at last visit as well. Get him through his PCN. ? Did he ever try a steroid topical for this  Dysphagia with possible thrush I could not see thrush in his oropharynx AGAIN. I did write rx for fluconazole x 14 days. If he comes bback with more symptoms and zero objective evidence will see about getting him into GI for EGD  Chronic hepatitis C without hepatic coma:get his HIV controlled before proceeding  Syphilis: treatiing with IM PCN.   I spent greater than 25 minutes with the patient including greater than 50% of time in face to face counsel of the patient re his HIV, his new ARV regimen, his depression, his HCV facial rash, syphilis and possible thrush and in coordination of his care.

## 2016-07-26 ENCOUNTER — Ambulatory Visit (INDEPENDENT_AMBULATORY_CARE_PROVIDER_SITE_OTHER): Payer: Self-pay

## 2016-07-26 DIAGNOSIS — Z113 Encounter for screening for infections with a predominantly sexual mode of transmission: Secondary | ICD-10-CM

## 2016-07-26 MED ORDER — PENICILLIN G BENZATHINE 1200000 UNIT/2ML IM SUSP
1.2000 10*6.[IU] | Freq: Once | INTRAMUSCULAR | Status: AC
  Administered 2016-07-26: 1.2 10*6.[IU] via INTRAMUSCULAR

## 2016-09-10 ENCOUNTER — Emergency Department (HOSPITAL_BASED_OUTPATIENT_CLINIC_OR_DEPARTMENT_OTHER): Payer: Self-pay

## 2016-09-10 ENCOUNTER — Observation Stay (HOSPITAL_BASED_OUTPATIENT_CLINIC_OR_DEPARTMENT_OTHER)
Admission: EM | Admit: 2016-09-10 | Discharge: 2016-09-10 | Disposition: A | Discharge status: Home / Self Care | Payer: Self-pay | Attending: Emergency Medicine | Admitting: Emergency Medicine

## 2016-09-10 ENCOUNTER — Encounter (HOSPITAL_COMMUNITY)
Admission: EM | Disposition: A | Discharge status: Home / Self Care | Payer: Self-pay | Source: Home / Self Care | Attending: Emergency Medicine

## 2016-09-10 ENCOUNTER — Encounter (HOSPITAL_BASED_OUTPATIENT_CLINIC_OR_DEPARTMENT_OTHER): Payer: Self-pay | Admitting: *Deleted

## 2016-09-10 ENCOUNTER — Ambulatory Visit (HOSPITAL_COMMUNITY): Admit: 2016-09-10 | Payer: Self-pay | Admitting: Gastroenterology

## 2016-09-10 DIAGNOSIS — W228XXA Striking against or struck by other objects, initial encounter: Secondary | ICD-10-CM | POA: Insufficient documentation

## 2016-09-10 DIAGNOSIS — R1310 Dysphagia, unspecified: Principal | ICD-10-CM | POA: Insufficient documentation

## 2016-09-10 DIAGNOSIS — Z79899 Other long term (current) drug therapy: Secondary | ICD-10-CM | POA: Insufficient documentation

## 2016-09-10 DIAGNOSIS — W44F3XA Food entering into or through a natural orifice, initial encounter: Secondary | ICD-10-CM | POA: Diagnosis present

## 2016-09-10 DIAGNOSIS — T18128A Food in esophagus causing other injury, initial encounter: Secondary | ICD-10-CM | POA: Diagnosis present

## 2016-09-10 DIAGNOSIS — B20 Human immunodeficiency virus [HIV] disease: Secondary | ICD-10-CM | POA: Insufficient documentation

## 2016-09-10 DIAGNOSIS — K222 Esophageal obstruction: Secondary | ICD-10-CM | POA: Insufficient documentation

## 2016-09-10 DIAGNOSIS — S0990XA Unspecified injury of head, initial encounter: Secondary | ICD-10-CM | POA: Insufficient documentation

## 2016-09-10 DIAGNOSIS — B182 Chronic viral hepatitis C: Secondary | ICD-10-CM | POA: Insufficient documentation

## 2016-09-10 DIAGNOSIS — K449 Diaphragmatic hernia without obstruction or gangrene: Secondary | ICD-10-CM | POA: Insufficient documentation

## 2016-09-10 DIAGNOSIS — K210 Gastro-esophageal reflux disease with esophagitis: Secondary | ICD-10-CM | POA: Insufficient documentation

## 2016-09-10 DIAGNOSIS — Z8719 Personal history of other diseases of the digestive system: Secondary | ICD-10-CM | POA: Insufficient documentation

## 2016-09-10 HISTORY — PX: ESOPHAGOGASTRODUODENOSCOPY (EGD) WITH PROPOFOL: SHX5813

## 2016-09-10 SURGERY — ESOPHAGOGASTRODUODENOSCOPY (EGD) WITH PROPOFOL
Anesthesia: Monitor Anesthesia Care

## 2016-09-10 MED ORDER — PROPOFOL 1000 MG/100ML IV EMUL
0.0000 ug/kg/min | INTRAVENOUS | Status: DC
  Administered 2016-09-10 (×3): 5 ug/kg/min via INTRAVENOUS
  Filled 2016-09-10: qty 100

## 2016-09-10 MED ORDER — SODIUM CHLORIDE 0.9 % IV SOLN
INTRAVENOUS | Status: DC
  Administered 2016-09-10: 20 mL/h via INTRAVENOUS

## 2016-09-10 MED ORDER — SODIUM CHLORIDE 0.9 % IV BOLUS (SEPSIS)
1000.0000 mL | Freq: Once | INTRAVENOUS | Status: AC
  Administered 2016-09-10: 1000 mL via INTRAVENOUS

## 2016-09-10 MED ORDER — MORPHINE SULFATE (PF) 2 MG/ML IV SOLN
2.0000 mg | Freq: Once | INTRAVENOUS | Status: AC
Start: 2016-09-10 — End: 2016-09-10
  Administered 2016-09-10: 2 mg via INTRAVENOUS
  Filled 2016-09-10: qty 1

## 2016-09-10 MED ORDER — GLUCAGON HCL RDNA (DIAGNOSTIC) 1 MG IJ SOLR
1.0000 mg | Freq: Once | INTRAMUSCULAR | Status: AC
  Administered 2016-09-10: 1 mg via INTRAVENOUS
  Filled 2016-09-10: qty 1

## 2016-09-10 MED ORDER — ONDANSETRON HCL 4 MG/2ML IJ SOLN
4.0000 mg | Freq: Once | INTRAMUSCULAR | Status: AC
  Administered 2016-09-10: 4 mg via INTRAVENOUS
  Filled 2016-09-10: qty 2

## 2016-09-10 MED ORDER — PROPOFOL 1000 MG/100ML IV EMUL
INTRAVENOUS | Status: AC
  Administered 2016-09-10: 5 ug/kg/min via INTRAVENOUS
  Filled 2016-09-10: qty 100

## 2016-09-10 SURGICAL SUPPLY — 14 items

## 2016-09-10 NOTE — ED Notes (Signed)
Patient transported to CT 

## 2016-09-10 NOTE — Discharge Instructions (Signed)
Gastroesophageal Reflux Disease, Adult Normally, food travels down the esophagus and stays in the stomach to be digested. If a person has gastroesophageal reflux disease (GERD), food and stomach acid move back up into the esophagus. When this happens, the esophagus becomes sore and swollen (inflamed). Over time, GERD can make small holes (ulcers) in the lining of the esophagus. Follow these instructions at home: Diet  Follow a diet as told by your doctor. You may need to avoid foods and drinks such as: ? Coffee and tea (with or without caffeine). ? Drinks that contain alcohol. ? Energy drinks and sports drinks. ? Carbonated drinks or sodas. ? Chocolate and cocoa. ? Peppermint and mint flavorings. ? Garlic and onions. ? Horseradish. ? Spicy and acidic foods, such as peppers, chili powder, curry powder, vinegar, hot sauces, and BBQ sauce. ? Citrus fruit juices and citrus fruits, such as oranges, lemons, and limes. ? Tomato-based foods, such as red sauce, chili, salsa, and pizza with red sauce. ? Fried and fatty foods, such as donuts, french fries, potato chips, and high-fat dressings. ? High-fat meats, such as hot dogs, rib eye steak, sausage, ham, and bacon. ? High-fat dairy items, such as whole milk, butter, and cream cheese.  Eat small meals often. Avoid eating large meals.  Avoid drinking large amounts of liquid with your meals.  Avoid eating meals during the 2-3 hours before bedtime.  Avoid lying down right after you eat.  Do not exercise right after you eat. General instructions  Pay attention to any changes in your symptoms.  Take over-the-counter and prescription medicines only as told by your doctor. Do not take aspirin, ibuprofen, or other NSAIDs unless your doctor says it is okay.  Do not use any tobacco products, including cigarettes, chewing tobacco, and e-cigarettes. If you need help quitting, ask your doctor.  Wear loose clothes. Do not wear anything tight around  your waist.  Raise (elevate) the head of your bed about 6 inches (15 cm).  Try to lower your stress. If you need help doing this, ask your doctor.  If you are overweight, lose an amount of weight that is healthy for you. Ask your doctor about a safe weight loss goal.  Keep all follow-up visits as told by your doctor. This is important. Contact a doctor if:  You have new symptoms.  You lose weight and you do not know why it is happening.  You have trouble swallowing, or it hurts to swallow.  You have wheezing or a cough that keeps happening.  Your symptoms do not get better with treatment.  You have a hoarse voice. Get help right away if:  You have pain in your arms, neck, jaw, teeth, or back.  You feel sweaty, dizzy, or light-headed.  You have chest pain or shortness of breath.  You throw up (vomit) and your throw up looks like blood or coffee grounds.  You pass out (faint).  Your poop (stool) is bloody or black.  You cannot swallow, drink, or eat. This information is not intended to replace advice given to you by your health care provider. Make sure you discuss any questions you have with your health care provider. Document Released: 08/31/2007 Document Revised: 08/20/2015 Document Reviewed: 07/09/2014 Elsevier Interactive Patient Education  2018 [employer]  You cannot drive or Use sharp objects today may have soft solids today and talked to your infectious disease doctor but recommend over-the-counter Prilosec or Nexium twice a day for 2 weeks then once a day  long-term and call if swallowing problems continue or follow-up with her local gastroenterologist and follow-up with me as needed or in one month to check on symptoms and chew food well eat slowly and drink plenty of liquids while eating

## 2016-09-10 NOTE — Consult Note (Signed)
[facility] RESIDENT CONSULT NOTE   Date: 09/10/2016               Patient Name:  Micael Barb MRN: [id number]  DOB: June 08, 1965 Age / Sex: 51 y.o., male   PCP: Patient, No Pcp Per         Medical Service: Internal Medicine Teaching Service         Attending Physician: Dr. Lucious Groves, DO    First Contact: Dr. Jari Favre Pager: [phone]  Second Contact: Dr. Juleen China Pager: [phone]       After Hours (After 5p/  First Contact Pager: [phone]  weekends / holidays): Second Contact Pager: [phone]   Chief Complaint: dysphagia  History of Present Illness:  Mr. Stocks is a 51yo male with PMH of HIV, HCV presenting to [facility] for dysphagia, odynophagia which started after he accidentally hit his head on a door. Patient endorses prior history of dysphagia and multiple EGD's (thinks he may have had an esophageal ulcer found at one point) [place] over the last several years. Patient is followed by Dr. Tommy Medal at [facility] who empirically treated him for candidal esophagitis with 2wk course of fluconazole, however without change in his dysphagia. Patient states that after hitting his head, he was eating mac&cheese which he felt got stuck in his upper chest with associated sharp pain; he began vomiting but only brought up a little of the food, and mostly just saliva repeatedly through the night. He has not been able to tolerate liquids. Currently he feels the pain has moved to his lower chest and is still sharp in nature. He does report new acid reflux over the last week whch is not usual for him. He endorses occasional NSAID use. He denies abdominal pain, nausea, further emesis of food, bilious or bloody emesis, fevers, chills.    In April 2018, CD4 count was 180, viral load 74; patient states he is compliant with his Prezcobix and Tiger; he is not consistent in taking his prophylactic bactrim.   In the ED, CT head was negative for lesions, hemorrhage, or hematoma. Glucagon  was administered without relief. GI was consulted and will perform bedside EGD and bolus disimpaction. Originally, plans were to admit patient due to not having transportation home until tomorrow so IMTS was contacted for admission; however since then, ride has been secured.  Meds:  Current Meds  Medication Sig  . darunavir-cobicistat (PREZCOBIX) 800-150 MG tablet Take 1 tablet by mouth daily.  Marland Kitchen emtricitabine-tenofovir AF (DESCOVY) 200-25 MG tablet Take 1 tablet by mouth daily.  Marland Kitchen sulfamethoxazole-trimethoprim (BACTRIM DS,SEPTRA DS) 800-160 MG tablet Take 1 tablet by mouth 2 (two) times daily.    Allergies: Allergies as of 09/10/2016 - Review Complete 09/10/2016  Allergen Reaction Noted  . Codeine Itching 01/18/2016   Past Medical History:  Diagnosis Date  . AIDS ([place]) 01/18/2016  . Depression 01/18/2016  . Dysphagia 07/19/2016  . Facial rash 03/03/2016  . Hepatitis C   . HIV positive ([place])   . Thrush 03/03/2016    Family History: Patient denies family history of GI disorders, cardiac history or CVAs. Mother and aunt had to have esophageal dilation multiple times - unknown cause.   Social History: Patient denies tobacco use; endorses occasional EtOH use; h/o of remote IVDU  Review of Systems: A complete ROS was negative except as per HPI.   Physical Exam: Blood pressure 117/80, pulse 65, temperature 97.8 F (36.6 C), temperature source Oral, resp. rate  16, height _0  (1.727 m), weight 160 lb (72.6 kg), SpO2 95 %. General: alert, well-developed, and cooperative to examination.  Head: normocephalic and atraumatic.  Eyes: vision grossly intact, no injection and anicteric.  Mouth: pharynx pink and moist, no erythema, no exudates or lesions.  Neck: supple, full ROM, no thyromegaly.  Lungs: normal respiratory effort, no accessory muscle use, normal breath sounds, no crackles, and no wheezes. Heart: normal rate, regular rhythm, no murmur, no gallop, and no rub.  Abdomen: soft,  mild epigastric tenderness, normal bowel sounds, no distention, no guarding, no rebound tenderness, no hepatomegaly.  Msk: no joint swelling, no joint warmth, and no redness over joints.  Pulses: 2+ DP/PT pulses bilaterally Extremities: No cyanosis, clubbing, edema Neurologic: alert & oriented X3, cranial nerves II-XII grossly intact, strength normal in all extremities.  Skin: turgor normal and no rashes.  Psych: Oriented X3, memory intact for recent and remote, normally interactive, good eye contact, not anxious appearing, and not depressed appearing  Assessment & Plan by Problem: Active Problems:   Food impaction of esophagus  Food impaction of esophagus Dysphagia: Patient with history of dysphagia +/- prior food impactions. He has poorly controlled HIV which does put him at risk for viral esophagitis; also has h/o esophageal ulcer so stricture/web is a possibility. Exam is remarkable for mild epigastric pain; no signs or symptoms of esophageal rupture. GI performing bedside EGD. Plan prior to IMTS consult was to discharge after EGD but dispo was issue; however, transportation has been secured and pending EGD, and no complications, patient is stable for original plan for discharge from ED.  --f/u EGD results/GI note  HIV: Encouraged patient to remain compliant with his Bactrim prophylaxis. Following with Dr. Tommy Medal.  Dispo: Pending EGD, likely discharge from ED.  Signed: Alphonzo Grieve, MD 09/10/2016, 12:47 PM  Pager [phone]

## 2016-09-10 NOTE — ED Notes (Addendum)
Patient states he now has a ride that can pick him up from procedure. Attempted to call endo to update.

## 2016-09-10 NOTE — ED Notes (Signed)
Attempted to call endo 

## 2016-09-10 NOTE — ED Triage Notes (Signed)
Pt states hit head last pm and developed ha,  Then about 30 minutes later after eating felt as if food is lodged in esophagus  Has thrown up 25 times  Clear salvia   States is causing sharp stabbing cp

## 2016-09-10 NOTE — ED Notes (Signed)
[facility] called advised patient would need a ride home post procedure. Patient reports has a ride but does not know when he can come. Patient advised due to sedation he must have ride.

## 2016-09-10 NOTE — ED Provider Notes (Signed)
PROGRESS NOTE                                                                                                                 This is a transfer from [facility]. Patient with HIV and multiple prior episodes of food impaction out of state. Patient also had frontal head trauma with a negative head CT. Plan is for gastroenterology to scope and removed food bolus. This patient does not have a ride home or anyone to care for him after the sedation he will need admission. Unassigned admission to family practice, discussed with resident Dr. Marrian SalvageWallace        Tikesha Mort, [place], PA-C 09/10/16 1044    Loren RacerYelverton, David, MD 09/11/16 [phone]

## 2016-09-10 NOTE — ED Provider Notes (Addendum)
[facility] Provider Note   CSN: [id number] Arrival date & time: 09/10/16  0155     History   Chief Complaint Chief Complaint  Patient presents with  . Emesis    HPI Jason [name] is a 51 y.o. male.  HPI  This a 51 year old male with a history of AIDS, dysphagia with history of prior food impactions who presents with concerns for food impaction. Patient states that initially this evening he turned around and forcefully hit his head on a doorway. He felt nauseous but did not vomit after this. He is not on blood thinners. He ate a normal dinner including macaroni and cheese and beef. After eating, he felt like some beef got lodged in his esophagus. He reports multiple episodes of clear emesis. Unable to tolerate fluids or solids. Reports that he has mid sternal fullness. Patient reports that he has had multiple food impactions in the past requiring endoscopy. [place] was [place] in [place]. He does not have a gastroenterologist in [place].  Past Medical History:  Diagnosis Date  . AIDS (HCC) 01/18/2016  . Depression 01/18/2016  . Dysphagia 07/19/2016  . Facial rash 03/03/2016  . Hepatitis C   . HIV positive (HCC)   . Thrush 03/03/2016    Patient Active Problem List   Diagnosis Date Noted  . Dysphagia 07/19/2016  . Facial rash 03/03/2016  . Thrush 03/03/2016  . AIDS (HCC) 01/18/2016  . Depression 01/18/2016  . Dog bite of left calf 12/14/2014  . 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 Chronic hepatitis C without hepatic coma (HCC) 05/01/2014  . Human immunodeficiency virus (HIV) disease (HCC) 12/09/2013    History reviewed. No pertinent surgical history.     Home Medications    Prior to Admission medications   Medication Sig Start Date End Date Taking? Authorizing Provider  darunavir-cobicistat (PREZCOBIX) 800-150 MG tablet Take 1 tablet by mouth daily. 01/18/16   Randall HissVan Dam, Cornelius N, MD    emtricitabine-tenofovir AF (DESCOVY) 200-25 MG tablet Take 1 tablet by mouth daily. 01/18/16   Randall HissVan Dam, Cornelius N, MD  fluconazole (DIFLUCAN) 100 MG tablet Take 1 tablet (100 mg total) by mouth daily. 07/19/16   Randall HissVan Dam, Cornelius N, MD  sulfamethoxazole-trimethoprim (BACTRIM DS,SEPTRA DS) 800-160 MG tablet Take 1 tablet by mouth 2 (two) times daily. 01/18/16   Randall HissVan Dam, Cornelius N, MD    Family History No family history on file.  Social History Social History  Substance Use Topics  . Smoking status: Never Smoker  . Smokeless tobacco: Never Used  . Alcohol use No     Comment: rare     Allergies   Codeine   Review of Systems Review of Systems  Constitutional: Negative for fever.  Respiratory: Negative for shortness of breath.   Cardiovascular: Positive for chest pain.  Gastrointestinal: Positive for nausea and vomiting. Negative for abdominal pain.  Neurological: Positive for headaches.     Physical Exam Updated Vital Signs BP 135/89 (BP Location: Right Arm)   Pulse 79   Temp 98.2 F (36.8 C) (Oral)   Resp 16   Ht 5\' 8"  (1.727 m)   Wt 72.6 kg (160 lb)   SpO2 98%   BMI 24.33 kg/m   Physical Exam  Constitutional: He is oriented to person, place, and time. He appears well-developed and well-nourished. No distress.  HENT:  Head: Normocephalic.  Mouth/Throat: Oropharynx is clear and moist.  Small abrasion noted to the forehead  Eyes: Pupils are equal, round, and reactive to light.  Cardiovascular: Normal rate, regular rhythm and normal heart sounds.   No murmur heard. Pulmonary/Chest: Effort normal and breath sounds normal. No respiratory distress. He has no wheezes.  Abdominal: Soft. There is no tenderness.  Occasional retching with spit  Neurological: He is alert and oriented to person, place, and time.  Cranial nerves II through XII intact, fluent speech, normal gait  Skin: Skin is warm and dry.  Psychiatric: He has a normal mood and affect.  Nursing note  and vitals reviewed.    ED Treatments / Results  Labs (all labs ordered are listed, but only abnormal results are displayed) Labs Reviewed - No data to display  EKG  EKG Interpretation None       Radiology Ct Head Wo Contrast  Result Date: 09/10/2016 CLINICAL DATA:  Headache and head trauma. EXAM: CT HEAD WITHOUT CONTRAST TECHNIQUE: Contiguous axial images were obtained from the base of the skull through the vertex without intravenous contrast. COMPARISON:  None. FINDINGS: Brain: No mass lesion, intraparenchymal hemorrhage or extra-axial collection. No evidence of acute cortical infarct. Brain parenchyma and CSF-containing spaces are normal for age. Vascular: No hyperdense vessel or unexpected calcification. Skull: Normal visualized skull base, calvarium and extracranial soft tissues. Sinuses/Orbits: Bilateral maxillary mucosal thickening with high density secretions layering in the right maxillary sinus. Normal orbits. IMPRESSION: Normal CT of the brain. Electronically Signed   By: Deatra Robinson M.D.   On: 09/10/2016 03:16    Procedures Procedures (including critical care time)  Medications Ordered in ED Medications  glucagon (human recombinant) (GLUCAGEN) injection 1 mg (1 mg Intravenous Given 09/10/16 0240)  ondansetron (ZOFRAN) injection 4 mg (4 mg Intravenous Given 09/10/16 0240)     Initial Impression / Assessment and Plan / ED Course  I have reviewed the triage vital signs and the nursing notes.  Pertinent labs & imaging results that were available during my care of the patient were reviewed by me and considered in my medical decision making (see chart for details).  Clinical Course as of Sep 10 320  Sat Sep 10, 2016  0303 Pt states that his throat feels more "relaxed" after glucagon but still with FB sensation.  Will attempt to PO with carbonated beverage.  [CH]    Clinical Course User Index [CH] Judaea Burgoon, Mayer Masker, MD    Patient presents with concerns for food  impaction. He also reports headache related to head trauma prior to feeling like his feet got impacted. Unfortunately, he has had multiple such episodes of emesis which I feel are likely related to food impaction; however, given that they were following head trauma cannot rule out traumatic injury as well. For this reason, CT scan was obtained. He was given glucagon and Zofran. He felt better following the glucagon and Zofran but still felt a sensation of food impaction. He is unable to tolerate a carbonated average. Will consult with gastroenterology. CT scan negative.  3:48 AM Discussed with Dr. Ewing Schlein, gastroenterology.  He has a case scheduled at 7:30 AM in the endoscopy suite at [facility]. He will schedule patient at 8:30 AM for endoscopy. I discussed this with the patient.  Patient continues to feel somewhat nauseous but is no longer retching. Feel he needs mmonitoring until endoscopy as he could perforate. Discussed ED to ED transfer until endoscopy with Dr. Manus Gunning who has accepted the patient transfer to the ED.  Also discussed the patient with Crisco, RN he states that  there is no room at [facility] ED. Discussed with charge RN risk and benefits of transfer. There is no specialty care if the patient were to perforate here. However I have offered to observe the patient here for short duration of time and then sent to [facility].  6:32 AM Patient resting comfortably. Reports that he is glad that he was able to sleep some. Reports some persistent foreign body feeling. [facility]AR here for transport.  Stable for transport.  Final Clinical Impressions(s) / ED Diagnoses   Final diagnoses:  Minor head injury, initial encounter  Food impaction of esophagus, initial encounter    New Prescriptions New Prescriptions   No medications on file     Shon Baton, MD 09/10/16 0407    Shon Baton, MD 09/10/16 [phone]

## 2016-09-10 NOTE — ED Notes (Signed)
Attempted to call endo

## 2016-09-10 NOTE — ED Notes (Signed)
ENDO at bedside.  Pt's ride has arrived so procedure can take place.

## 2016-09-10 NOTE — Consult Note (Signed)
Reason for Consult: Food impaction Referring Physician: ER physician  Jason [name] is an 51 y.o. male.  HPI: Patient with a history of food impactions in the past and history of esophageal ulcers but no problems in the last 5 years until last night and he believes he has either made her mac & cheese caught and his last few sips of water seems to stay down but he feels like it still may be caught and he requests an endoscopy to be sure and has no other GI complaints  Past Medical History:  Diagnosis Date  . AIDS ([place]) 01/18/2016  . Depression 01/18/2016  . Dysphagia 07/19/2016  . Facial rash 03/03/2016  . Hepatitis C   . HIV positive ([place])   . Thrush 03/03/2016    History reviewed. No pertinent surgical history.  History reviewed. No pertinent family history.  Social History:  reports that he has never smoked. He has never used smokeless tobacco. He reports that he uses drugs, including Marijuana. He reports that he does not drink alcohol.  Allergies:  Allergies  Allergen Reactions  . Codeine Itching    Medications: I have reviewed the patient's current medications.  No results found for this or any previous visit (from the past 48 hour(s)).  Ct Head Wo Contrast  Result Date: 09/10/2016 CLINICAL DATA:  Headache and head trauma. EXAM: CT HEAD WITHOUT CONTRAST TECHNIQUE: Contiguous axial images were obtained from the base of the skull through the vertex without intravenous contrast. COMPARISON:  None. FINDINGS: Brain: No mass lesion, intraparenchymal hemorrhage or extra-axial collection. No evidence of acute cortical infarct. Brain parenchyma and CSF-containing spaces are normal for age. Vascular: No hyperdense vessel or unexpected calcification. Skull: Normal visualized skull base, calvarium and extracranial soft tissues. Sinuses/Orbits: Bilateral maxillary mucosal thickening with high density secretions layering in the right maxillary sinus. Normal orbits. IMPRESSION: Normal CT of the  brain. Electronically Signed   By: Ulyses Jarred M.D.   On: 09/10/2016 03:16    ROS negative except above Blood pressure 120/78, pulse 65, temperature 97.8 F (36.6 C), temperature source Oral, resp. rate 14, height _0  (1.727 m), weight 72.6 kg (160 lb), SpO2 96 %. Physical Exam physical exam vital signs stable afebrile no acute distress exam please see preassessment evaluation patient is able to handle his secretions and not spitting into a cup  Assessment/Plan: Food impaction I believe it's probably resolved Plan: The risks benefits and methods of endoscopy was discussed including if food is still in there and needing removal and will proceed with nursing sedation and ER propofol with further workup and plans pending those findings  Avantika Shere E 09/10/2016, 1:14 PM

## 2016-09-10 NOTE — ED Notes (Signed)
Received call from [name] with [facility] stating that Jason [name] does not have a bed for this pt at this time so they cannot come for him until they do. Dr. Wilkie AyeHorton aware.

## 2016-09-10 NOTE — ED Notes (Signed)
Pt was prepared for POV transport to [facility] ED but [facility] required us to hold pt at this time because they do not have a room.  Pts ride was not able to wait so pt will now have to be transported by [facility]. Dr. Wilkie AyeHorton aware.

## 2016-09-10 NOTE — ED Notes (Signed)
Endoscopy and Admitting MD made aware patient has ride. Will notify admitting to cancel admission upon arrival of patients ride home. Patient called ride [place] [phone] will arrive in approximately 30 minutes.

## 2016-09-12 NOTE — Op Note (Signed)
[facility] Patient Name: Jason Sheehanreston Sohm Procedure Date : 09/10/2016 MRN: [id number] Attending MD: Vida RiggerMarc Mikail Goostree , MD Date of Birth: 1966-01-30 CSN: [id number] Age: 51 Admit Type: Outpatient Procedure:                Upper GI endoscopy Indications:              Dysphagia,? Foreign body in the esophagus Providers:                Vida RiggerMarc Deshanta Lady, MD, Roselie AwkwardShannon Love, RN, Jacqulyn LinerAlexis Powell,                            Technician Referring MD:              Medicines:                Propofol per ER nurse Complications:            No immediate complications. Estimated Blood Loss:     Estimated blood loss: none. Procedure:                Pre-Anesthesia Assessment:                           - Prior to the procedure, a History and Physical                            was performed, and patient medications and                            allergies were reviewed. The patient's tolerance of                            previous anesthesia was also reviewed. The risks                            and benefits of the procedure and the sedation                            options and risks were discussed with the patient.                            All questions were answered, and informed consent                            was obtained. Prior Anticoagulants: The patient has                            taken no previous anticoagulant or antiplatelet                            agents. ASA Grade Assessment: II - A patient with                            mild systemic disease. After reviewing the risks  and benefits, the patient was deemed in                            satisfactory condition to undergo the procedure.                           After obtaining informed consent, the endoscope was                            passed under direct vision. Throughout the                            procedure, the patient's blood pressure, pulse, and                            oxygen saturations  were monitored continuously. The                            EG-[id number] ([id number]) scope was introduced through the                            mouth, and advanced to the second part of duodenum.                            The upper GI endoscopy was accomplished without                            difficulty. The patient tolerated the procedure                            fairly well. Scope In: Scope Out: Findings:      A medium-sized hiatal hernia was present.      One mild benign-appearing, intrinsic stenosis was found. And was       traversed 10 widely patent.      Moderately severe distal esophagitis with no bleeding was found.      The entire examined stomach was normal.      The duodenal bulb, first portion of the duodenum and second portion of       the duodenum were normal.      The exam was otherwise without abnormality. Impression:               - Medium-sized hiatal hernia.                           - Benign-appearing widely patentesophageal stenosis.                           - Moderately severe distal reflux esophagitis.                           - Normal stomach.                           - Normal duodenal bulb, first portion of the  duodenum and second portion of the duodenum.                           - The examination was otherwise normal.                           - No specimens collected. Moderate Sedation:      moderate sedation-none Recommendation:           - Patient has a contact number available for                            emergencies. The signs and symptoms of potential                            delayed complications were discussed with the                            patient. Return to normal activities tomorrow.                            Written discharge instructions were provided to the                            patient.                           - Soft diet today.                           - Continue present medications.                            - Use Prilosec (omeprazole) 20 mg PO BID for 2                            weeks. then can decrease to once a day                           - Return to GI clinic in 4 weeks.                           - Telephone GI clinic if symptomatic PRN. Procedure Code(s):        --- Professional ---                           [phone], Esophagogastroduodenoscopy, flexible,                            transoral; diagnostic, including collection of                            specimen(s) by brushing or washing, when performed                            (separate procedure) Diagnosis Code(s):        ---  Professional ---                           K44.9, Diaphragmatic hernia without obstruction or                            gangrene                           K22.2, Esophageal obstruction                           K21.0, Gastro-esophageal reflux disease with                            esophagitis                           R13.10, Dysphagia, unspecified                           T18.108A, Unspecified foreign body in esophagus                            causing other injury, initial encounter CPT copyright 2016 American Medical Association. All rights reserved. The codes documented in this report are preliminary and upon coder review may  be revised to meet current compliance requirements. Vida Rigger, MD 09/10/2016 1:57:19 PM This report has been signed electronically. Number of Addenda: 0

## 2016-10-12 ENCOUNTER — Other Ambulatory Visit: Payer: Self-pay | Admitting: Infectious Disease

## 2016-10-12 ENCOUNTER — Other Ambulatory Visit: Payer: Self-pay | Admitting: *Deleted

## 2016-10-12 DIAGNOSIS — B20 Human immunodeficiency virus [HIV] disease: Secondary | ICD-10-CM

## 2016-10-12 MED ORDER — SULFAMETHOXAZOLE-TRIMETHOPRIM 800-160 MG PO TABS: 1.0000 | ORAL_TABLET | Freq: Two times a day (BID) | ORAL | 4 refills | Status: DC

## 2016-11-02 ENCOUNTER — Other Ambulatory Visit: Payer: Self-pay

## 2016-11-03 ENCOUNTER — Ambulatory Visit (INDEPENDENT_AMBULATORY_CARE_PROVIDER_SITE_OTHER): Payer: Self-pay | Admitting: Pharmacist Clinician (PhC)/ Clinical Pharmacy Specialist

## 2016-11-03 ENCOUNTER — Encounter: Payer: Self-pay | Admitting: Infectious Disease

## 2016-11-03 ENCOUNTER — Other Ambulatory Visit: Payer: Self-pay

## 2016-11-03 DIAGNOSIS — B20 Human immunodeficiency virus [HIV] disease: Secondary | ICD-10-CM

## 2016-11-03 DIAGNOSIS — B182 Chronic viral hepatitis C: Secondary | ICD-10-CM

## 2016-11-03 DIAGNOSIS — Z113 Encounter for screening for infections with a predominantly sexual mode of transmission: Secondary | ICD-10-CM

## 2016-11-03 DIAGNOSIS — Z79899 Other long term (current) drug therapy: Secondary | ICD-10-CM

## 2016-11-03 DIAGNOSIS — R1312 Dysphagia, oropharyngeal phase: Secondary | ICD-10-CM

## 2016-11-03 LAB — COMPLETE METABOLIC PANEL WITH GFR
ALT: 51 U/L — ABNORMAL HIGH (ref 9–46)
AST: 73 U/L — ABNORMAL HIGH (ref 10–35)
Albumin: 3.6 g/dL (ref 3.6–5.1)
Alkaline Phosphatase: 58 U/L (ref 40–115)
BUN: 23 mg/dL (ref 7–25)
CHLORIDE: 106 mmol/L (ref 98–110)
CO2: 23 mmol/L (ref 20–32)
Calcium: 8.4 mg/dL — ABNORMAL LOW (ref 8.6–10.3)
Creat: 0.94 mg/dL (ref 0.70–1.33)
GFR, Est African American: >89 mL/min (ref >=60)
GFR, Est Non African American: >89 mL/min (ref >=60)
GLUCOSE: 109 mg/dL — AB (ref 65–99)
POTASSIUM: 3.9 mmol/L (ref 3.5–5.3)
SODIUM: 137 mmol/L (ref 135–146)
Total Bilirubin: 0.5 mg/dL (ref 0.2–1.2)
Total Protein: 6.6 g/dL (ref 6.1–8.1)

## 2016-11-03 LAB — CBC WITH DIFFERENTIAL/PLATELET
BASOS ABS: 0 {cells}/uL (ref 0–200)
BASOS PCT: 0 %
EOS ABS: 138 {cells}/uL (ref 15–500)
Eosinophils Relative: 3 %
HCT: 36.8 % — ABNORMAL LOW (ref 38.5–50.0)
HEMOGLOBIN: 12.3 g/dL — AB (ref 13.2–17.1)
LYMPHS ABS: 1150 {cells}/uL (ref 850–3900)
Lymphocytes Relative: 25 %
MCH: 30.4 pg (ref 27.0–33.0)
MCHC: 33.4 g/dL (ref 32.0–36.0)
MCV: 91.1 fL (ref 80.0–100.0)
MPV: 9.1 fL (ref 7.5–12.5)
Monocytes Absolute: 1012 cells/uL — ABNORMAL HIGH (ref 200–950)
Monocytes Relative: 22 %
Neutro Abs: 2300 cells/uL (ref 1500–7800)
Neutrophils Relative %: 50 %
Platelets: 258 10*3/uL (ref 140–400)
RBC: 4.04 MIL/uL — ABNORMAL LOW (ref 4.20–5.80)
RDW: 13.9 % (ref 11.0–15.0)
WBC: 4.6 10*3/uL (ref 3.8–10.8)

## 2016-11-03 LAB — LIPID PANEL
Cholesterol: 166 mg/dL (ref <200)
HDL: 59 mg/dL (ref >40)
LDL CALC: 91 mg/dL (ref <100)
TRIGLYCERIDES: 79 mg/dL (ref <150)
Total CHOL/HDL Ratio: 2.8 Ratio (ref <5.0)
VLDL: 16 mg/dL (ref <30)

## 2016-11-03 NOTE — Progress Notes (Signed)
HPI: Jason [name] is a 51 y.o. male who is here to get labs and meet with [facility] for ADAP renewal.   Allergies: Allergies  Allergen Reactions  . Codeine Itching    Vitals:    Past Medical History: Past Medical History:  Diagnosis Date  . AIDS ([facility]) 01/18/2016  . Depression 01/18/2016  . Dysphagia 07/19/2016  . Facial rash 03/03/2016  . Hepatitis C   . HIV positive ([facility])   . Thrush 03/03/2016    Social History: Social History   Social History  . Marital status: Single    Spouse name: N/A  . Number of children: N/A  . Years of education: N/A   Social History Main Topics  . Smoking status: Never Smoker  . Smokeless tobacco: Never Used  . Alcohol use No     Comment: rare  . Drug use: Yes    Types: Marijuana  . Sexual activity: Not on file   Other Topics Concern  . Not on file   Social History Narrative  . No narrative on file    Previous Regimen:   Current Regimen: Prezcobix/Descovy  Labs: HIV 1 RNA Quant (copies/mL)  Date Value  07/06/2016 74 (H)  02/12/2016 4,934 (H)  11/23/2015 161,096 (H)   CD4 T Cell Abs (/uL)  Date Value  07/06/2016 180 (L)  03/03/2016 300 (L)  02/12/2016 150 (L)   Hep B S Ab (no units)  Date Value  11/23/2015 INDETER (A)   Hepatitis B Surface Ag (no units)  Date Value  11/23/2015 NEGATIVE   HCV Ab (no units)  Date Value  11/23/2015 REACTIVE (A)    CrCl: CrCl cannot be calculated (Patient's most recent lab result is older than the maximum 21 days allowed.).  Lipids:    Component Value Date/Time   CHOL 159 11/23/2015 1429   TRIG 165 (H) 11/23/2015 1429   HDL 51 11/23/2015 1429   CHOLHDL 3.1 11/23/2015 1429   VLDL 33 (H) 11/23/2015 1429   LDLCALC 75 11/23/2015 1429    Assessment: Jason [name] is here for a visit with labs and [facility] for ADAP renewal. Jason [name] from [facility] asked me to see him for a facial rash issue. He has somewhat of a butterfly rash on his face. He stated that it started after starting his ART. He was  wondering if Prezcobix could have caused that. 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 He was treated with Bicillin x3. Explained to him that it could drug related or syphilis related. Has been the the same partner in the past year. Condoms were not used. Explained to him that the risk for STDs is very high in this population, especially, if the partner was never treated. Counseled him to always be cautious when taking the partner's words for it. He thought that he was likely infected with syphilis in Jan of this year. Subsequently, he was dx and treated in April. I really wonder if he is re-infected at this point. Therefore, he is getting all labs today. Advised him to try some of the generic Allegra for the rash. If it doesn't resolve, call me back and we can change the ART to something else. He is willing to wait until a visit with [name] in Sept. Apparently, he has a hx of non-compliance while he was at [facility]. Maybe, PI might be his best option since we don't know his resistance mutations.   Recommendations:  Cont Prezc/Descovy for now Try Allegra  1 daily Labs today F/u with [name] in [place]  [name], PharmD, BCPS, AAHIVP, CPP Clinical Infectious Disease Pharmacist [facility] 11/03/2016, 10:34 AM

## 2016-11-04 LAB — RPR TITER

## 2016-11-04 LAB — RPR: RPR: REACTIVE — AB

## 2016-11-04 LAB — FLUORESCENT TREPONEMAL AB(FTA)-IGG-BLD: Fluorescent Treponemal ABS: REACTIVE — AB

## 2016-11-04 LAB — T-HELPER CELL (CD4) - (RCID CLINIC ONLY)
CD4 T CELL ABS: 140 /uL — AB (ref 400–2700)
CD4 T CELL HELPER: 11 % — AB (ref 33–55)

## 2016-11-05 LAB — HIV-1 RNA,QN PCR W/REFLEX GENOTYPE
HIV-1 RNA, QN PCR: <1.3 Log cps/mL
HIV-1 RNA, QN PCR: <20 Copies/mL

## 2016-11-07 LAB — HEPATITIS C GENOTYPE

## 2016-11-16 ENCOUNTER — Ambulatory Visit: Payer: Self-pay | Admitting: Infectious Disease

## 2016-11-30 ENCOUNTER — Ambulatory Visit: Payer: Self-pay | Admitting: Infectious Disease

## 2016-12-03 ENCOUNTER — Emergency Department (HOSPITAL_BASED_OUTPATIENT_CLINIC_OR_DEPARTMENT_OTHER): Payer: Self-pay

## 2016-12-03 ENCOUNTER — Encounter (HOSPITAL_BASED_OUTPATIENT_CLINIC_OR_DEPARTMENT_OTHER): Payer: Self-pay | Admitting: Emergency Medicine

## 2016-12-03 ENCOUNTER — Emergency Department (HOSPITAL_BASED_OUTPATIENT_CLINIC_OR_DEPARTMENT_OTHER)
Admission: EM | Admit: 2016-12-03 | Discharge: 2016-12-03 | Disposition: A | Discharge status: Home / Self Care | Payer: Self-pay | Attending: Emergency Medicine | Admitting: Emergency Medicine

## 2016-12-03 DIAGNOSIS — M25571 Pain in right ankle and joints of right foot: Secondary | ICD-10-CM | POA: Insufficient documentation

## 2016-12-03 DIAGNOSIS — M25572 Pain in left ankle and joints of left foot: Secondary | ICD-10-CM | POA: Insufficient documentation

## 2016-12-03 DIAGNOSIS — M79672 Pain in left foot: Secondary | ICD-10-CM

## 2016-12-03 DIAGNOSIS — B20 Human immunodeficiency virus [HIV] disease: Secondary | ICD-10-CM | POA: Insufficient documentation

## 2016-12-03 DIAGNOSIS — M79671 Pain in right foot: Secondary | ICD-10-CM

## 2016-12-03 DIAGNOSIS — E860 Dehydration: Secondary | ICD-10-CM | POA: Insufficient documentation

## 2016-12-03 NOTE — ED Provider Notes (Signed)
[facility] Provider Note   CSN: [id number] Arrival date & time: 12/03/16  0126     History   Chief Complaint Chief Complaint  Patient presents with  . Foreign Body in Skin  . Dehydration    HPI Trebor Galdamez is a 51 y.o. male.  The history is provided by the patient.  Foot Pain  This is a new problem. The problem occurs constantly. The problem has not changed since onset.The symptoms are aggravated by walking. The symptoms are relieved by rest.  patient reports bilateral foot pain over past day He thinks he walked on glass He reports that he has been outside and feels dehydrated because his A/C is broken No fever/vomiting No other acute complaints   Past Medical History:  Diagnosis Date  . AIDS ([facility]) 01/18/2016  . Depression 01/18/2016  . Dysphagia 07/19/2016  . Facial rash 03/03/2016  . Hepatitis C   . HIV positive ([facility])   . Thrush 03/03/2016    Patient Active Problem List   Diagnosis Date Noted  . Food impaction of esophagus 09/10/2016  . Dysphagia 07/19/2016  . Facial rash 03/03/2016  . Thrush 03/03/2016  . AIDS ([facility]) 01/18/2016  . Depression 01/18/2016  . Dog bite of left calf 12/14/2014  . 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 Chronic hepatitis C without hepatic coma ([facility]) 05/01/2014  . Human immunodeficiency virus (HIV) disease ([facility]) 12/09/2013    Past Surgical History:  Procedure Laterality Date  . ESOPHAGOGASTRODUODENOSCOPY (EGD) WITH PROPOFOL N/A 09/10/2016   Procedure: ESOPHAGOGASTRODUODENOSCOPY (EGD) WITH PROPOFOL;  Surgeon: Vida Rigger, MD;  Location: [facility] ENDOSCOPY;  Service: Endoscopy;  Laterality: N/A;       Home Medications    Prior to Admission medications   Medication Sig Start Date End Date Taking? Authorizing Provider  darunavir-cobicistat (PREZCOBIX) 800-150 MG tablet Take 1 tablet by mouth daily. 01/18/16   Randall Hiss, MD  emtricitabine-tenofovir AF  (DESCOVY) 200-25 MG tablet Take 1 tablet by mouth daily. 01/18/16   Randall Hiss, MD  fluconazole (DIFLUCAN) 100 MG tablet Take 1 tablet (100 mg total) by mouth daily. Patient not taking: Reported on 09/10/2016 07/19/16   Daiva Eves, Lisette Grinder, MD  sulfamethoxazole-trimethoprim (BACTRIM DS,SEPTRA DS) 800-160 MG tablet Take 1 tablet by mouth 2 (two) times daily. 10/12/16   Randall Hiss, MD    Family History No family history on file.  Social History Social History  Substance Use Topics  . Smoking status: Never Smoker  . Smokeless tobacco: Never Used  . Alcohol use No     Comment: rare     Allergies   Codeine   Review of Systems Review of Systems  Constitutional: Negative for fever.  Gastrointestinal: Negative for vomiting.  Musculoskeletal: Positive for arthralgias.     Physical Exam Updated Vital Signs BP 115/70 (BP Location: Right Arm)   Pulse 71   Temp 98.4 F (36.9 C) (Oral)   Resp 18   SpO2 99%   Physical Exam CONSTITUTIONAL: Disheveled, no acute distress HEAD: Normocephalic/atraumatic ENMT: Mucous membranes moist NECK: supple no meningeal signs CV: S1/S2 noted, no murmurs/rubs/gallops noted LUNGS: Lungs are clear to auscultation bilaterally, no apparent distress ABDOMEN: soft, nontender  NEURO: Pt is awake/alert/appropriate, moves all extremitiesx4.   EXTREMITIES: pulses normal/equal, full ROM Scattered blisters and skin tears to both feet No foreign bodies palpated.  No wounds noted in any of toe webspaces.  Distal pulses intact Mild  tenderness to left foot No deformities Mild tenderness to right foot SKIN: warm, color normal PSYCH: no abnormalities of mood noted, alert and oriented to situation   ED Treatments / Results  Labs (all labs ordered are listed, but only abnormal results are displayed) Labs Reviewed - No data to display  EKG  EKG Interpretation None       Radiology Dg Foot Complete Left  Result Date:  12/03/2016 CLINICAL DATA:  Stepped on glass.  Assess for foreign body. EXAM: LEFT FOOT - COMPLETE 3+ VIEW COMPARISON:  None. FINDINGS: There is no evidence of fracture or dislocation. There is no evidence of arthropathy or other focal bone abnormality. Soft tissues are unremarkable. IMPRESSION: Negative. Electronically Signed   By: Awilda Metroourtnay  Bloomer M.D.   On: 12/03/2016 06:04   Dg Foot Complete Right  Result Date: 12/03/2016 CLINICAL DATA:  Patient stepped on glass in the yard. EXAM: RIGHT FOOT COMPLETE - 3+ VIEW COMPARISON:  None. FINDINGS: There is a healing transverse fracture of the proximal right fourth metatarsal bone. Callus formation and sclerosis is present. No significant displacement. This could represent a stress fracture. No other fractures identified. Soft tissues are unremarkable. No radiopaque soft tissue foreign bodies. IMPRESSION: Healing transverse nondisplaced fracture of the proximal right fourth metatarsal bone. No radiopaque soft tissue foreign bodies. Electronically Signed   By: Burman NievesWilliam  Stevens M.D.   On: 12/03/2016 06:19    Procedures Procedures   Medications Ordered in ED Medications - No data to display   Initial Impression / Assessment and Plan / ED Course  I have reviewed the triage vital signs and the nursing notes.  Pertinent  imaging results that were available during my care of the patient were reviewed by me and considered in my medical decision making (see chart for details).     Pt stable No foreign bodies noted to either foot It appears he has healing fx to right foot but he reports most of his pain is in left foot, does not recall trauma to right foot, this is likely chronic Will refer to sports medicine Pt awake/alert, ambulatory D/c home   Final Clinical Impressions(s) / ED Diagnoses   Final diagnoses:  Bilateral foot pain    New Prescriptions Discharge Medication List as of 12/03/2016  6:28 AM       Zadie RhineWickline, Mukund Weinreb, MD 12/03/16 0730

## 2016-12-03 NOTE — ED Notes (Signed)
Pt lying flat for 10 mins for orthostatic vs.

## 2016-12-03 NOTE — ED Notes (Signed)
ED Provider at bedside. 

## 2016-12-03 NOTE — ED Notes (Signed)
Pt has not drank any of Gatorade given to him in triage.

## 2016-12-03 NOTE — ED Notes (Signed)
PMS intact before and after. Pt tolerated well. All questions answered. 

## 2016-12-03 NOTE — ED Notes (Signed)
Orthostatics at 3 minutes: BP-107/69  HR-94 Pt swaying and fidgeting the entire time he is standing during the orthostatic vital signs. EMT told pt to keep his eyes open so he wouldn't sway. Pt does not seem to be able to keep his eyes open

## 2016-12-03 NOTE — ED Triage Notes (Signed)
Pt states he stepped on glass in the yard and may have glass in bottom of both feet. Pt states he also feels dehydrated due to not having AC and working outside yesterday. Pt drinking soda in triage. Pt encouraged to stop drinking soda and given gatorade in triage.

## 2017-01-18 ENCOUNTER — Encounter: Payer: Self-pay | Admitting: Infectious Disease

## 2017-01-18 ENCOUNTER — Ambulatory Visit (INDEPENDENT_AMBULATORY_CARE_PROVIDER_SITE_OTHER): Payer: Self-pay | Admitting: Infectious Disease

## 2017-01-18 VITALS — BP 114/74 | HR 68 | Temp 98.3°F | Ht 68.0 in | Wt 161.0 lb

## 2017-01-18 DIAGNOSIS — K4091 Unilateral inguinal hernia, without obstruction or gangrene, recurrent: Secondary | ICD-10-CM

## 2017-01-18 DIAGNOSIS — B20 Human immunodeficiency virus [HIV] disease: Secondary | ICD-10-CM

## 2017-01-18 DIAGNOSIS — B182 Chronic viral hepatitis C: Secondary | ICD-10-CM

## 2017-01-18 DIAGNOSIS — F331 Major depressive disorder, recurrent, moderate: Secondary | ICD-10-CM

## 2017-01-18 DIAGNOSIS — K4090 Unilateral inguinal hernia, without obstruction or gangrene, not specified as recurrent: Secondary | ICD-10-CM

## 2017-01-18 DIAGNOSIS — R21 Rash and other nonspecific skin eruption: Secondary | ICD-10-CM

## 2017-01-18 DIAGNOSIS — Z23 Encounter for immunization: Secondary | ICD-10-CM

## 2017-01-18 HISTORY — DX: Rash and other nonspecific skin eruption: R21

## 2017-01-18 HISTORY — DX: Unilateral inguinal hernia, without obstruction or gangrene, not specified as recurrent: K40.90

## 2017-01-18 MED ORDER — BICTEGRAVIR-EMTRICITAB-TENOFOV 50-200-25 MG PO TABS: 1.0000 | ORAL_TABLET | Freq: Every day | ORAL | 11 refills | Status: DC

## 2017-01-18 NOTE — Progress Notes (Signed)
Subjective:   Chief complaint: persistence of malar rash which itches, worsening of pain from inguinal hernia that persists   Patient ID: Jason [name], male    DOB: October 20, 1965, 51 y.o.   MRN: [id number]  HPI  51 year old Caucasian man with HIV, and HCV coinfection, Initially cared for in [place] at [facility]. [place] has some [place] and [place] [facility]. Unfortunately while there he has not been very adherent to his antiretroviral regimen of Prezista NORVIR and Truvada. He was off medicines for several months and decided to change care to [facility]. Most recent viral load with is over 180,000 and his CD4 count is 80.  We put him back on a protease inhibitor based regimen and were going to stared  him back on PREZCOBIX with DESCOVY and use Bactrim for PCP prophylaxis.  He had flare of viremia in the Fall of 2017 that has since come under control when checked on repeat labs. He is taking Prezcobix and Descocvy but not taking his Bactrim reliably.  Lab Results  Component Value Date   HIV1RNAQUANT 74 (H) 07/06/2016   HIV1RNAQUANT 4,934 (H) 02/12/2016   HIV1RNAQUANT 782,956 (H) 11/23/2015   Lab Results  Component Value Date   CD4TABS 140 (L) 11/03/2016   CD4TABS 180 (L) 07/06/2016   CD4TABS 300 (L) 03/03/2016    He had been diagnosed and treated for syphilis and titers coming down. For the past 6+ months he has had a facial rash that has NOT improved after changing detergents. He is no longer taking bactrim reliably. It is on his cheecks, his nose and forehead but no where else.   He states that a friend who has SLE let him try a cream on his face that DID help it  He is also have worsening pain from his left sided inguinal hernia that he points out is visible even through his jeans.   He is also noticing problems with gas and loose stools.    Past Medical History:  Diagnosis Date  . AIDS (HCC) 01/18/2016  .  Depression 01/18/2016  . Dysphagia 07/19/2016  . Facial rash 03/03/2016  . Hepatitis C   . HIV positive (HCC)   . Thrush 03/03/2016    Past Surgical History:  Procedure Laterality Date  . ESOPHAGOGASTRODUODENOSCOPY (EGD) WITH PROPOFOL N/A 09/10/2016   Procedure: ESOPHAGOGASTRODUODENOSCOPY (EGD) WITH PROPOFOL;  Surgeon: Vida Rigger, MD;  Location: [facility] ENDOSCOPY;  Service: Endoscopy;  Laterality: N/A;    No family history on file.    Social History   Social History  . Marital status: Single    Spouse name: N/A  . Number of children: N/A  . Years of education: N/A   Social History Main Topics  . Smoking status: Never Smoker  . Smokeless tobacco: Never Used  . Alcohol use No     Comment: rare  . Drug use: Yes    Types: Marijuana  . Sexual activity: Not Asked   Other Topics Concern  . None   Social History Narrative  . None    Allergies  Allergen Reactions  . Codeine Itching     Current Outpatient Prescriptions:  .  darunavir-cobicistat (PREZCOBIX) 800-150 MG tablet, Take 1 tablet by mouth daily., Disp: 30 tablet, Rfl: 11 .  emtricitabine-tenofovir AF (DESCOVY) 200-25 MG tablet, Take 1 tablet by mouth daily., Disp: 30 tablet, Rfl: 11 .  sulfamethoxazole-trimethoprim (BACTRIM DS,SEPTRA DS) 800-160 MG tablet, Take 1  tablet by mouth 2 (two) times daily., Disp: 30 tablet, Rfl: 4 .  fluconazole (DIFLUCAN) 100 MG tablet, Take 1 tablet (100 mg total) by mouth daily. (Patient not taking: Reported on 09/10/2016), Disp: 14 tablet, Rfl: 0   Review of Systems  Constitutional: Negative for chills and fever.  HENT: Negative for congestion.   Eyes: Negative for photophobia.  Respiratory: Negative for cough, shortness of breath and wheezing.   Cardiovascular: Negative for chest pain, palpitations and leg swelling.  Gastrointestinal: Positive for abdominal distention and diarrhea. Negative for abdominal pain, blood in stool, constipation, nausea and vomiting.  Genitourinary: Positive  for scrotal swelling. Negative for dysuria, flank pain and hematuria.  Musculoskeletal: Negative for back pain and myalgias.  Skin: Positive for rash.  Allergic/Immunologic: Positive for immunocompromised state.  Neurological: Negative for dizziness, weakness and headaches.  Hematological: Does not bruise/bleed easily.  Psychiatric/Behavioral: Negative for decreased concentration, dysphoric mood, self-injury, sleep disturbance and suicidal ideas. The patient is not nervous/anxious.        Objective:   Physical Exam  Constitutional: He is oriented to person, place, and time. He appears well-developed and well-nourished. No distress.  HENT:  Head: Normocephalic and atraumatic.  Mouth/Throat: Uvula is midline and oropharynx is clear and moist. No oropharyngeal exudate.  Eyes: Conjunctivae and EOM are normal. No scleral icterus.  Neck: Normal range of motion. Neck supple.  Cardiovascular: Normal rate and regular rhythm.   Pulmonary/Chest: Effort normal. No respiratory distress. He has no wheezes.  Abdominal: He exhibits no distension. A hernia is present. Hernia confirmed positive in the left inguinal area.  Genitourinary:     Musculoskeletal: He exhibits no edema or tenderness.  Neurological: He is alert and oriented to person, place, and time. He exhibits normal muscle tone. Coordination normal.  Skin: Skin is warm and dry. No rash noted. He is not diaphoretic. No erythema. No pallor.     Psychiatric: His behavior is normal. Judgment and thought content normal. His mood appears anxious. He exhibits a depressed mood.  Nursing note and vitals reviewed.   Malar rash 01/18/17:          Assessment & Plan:   Worsening rash:  I will order labs for possible SLE whether he has this as autoimmune or drug induced.   We will DC the bactrim (he is also not taking reliably anyway)  I will switch him from [facility] in case the DRV is causing the rash  If not  improving after changing drugs can try steroid cream   HIV/AIDS: switch  BIKTARVY and do without PCP prophylaxis while we try to understand what is going on with his rash. I have reviewed his labs with him and explained how [facility] is to [facility] and that we will recheck labs one month into therapy   Chronic hepatitis C without hepatic coma:I want to treat this but want his facial rash problem better understood and continued adherence to ARV demonstrated  Syphilis: treated with IM PCN.   Worsening pain related to untreated inguinal hernia: Unfortuantely the local surgeons in [place] do not operate on uninsured patients in nonemergent situations so [facility]

## 2017-01-23 LAB — CRYOGLOBULIN: CRYOGLOBULIN, QUALITATIVE ANALYSIS: NOT DETECTED

## 2017-01-23 LAB — ANTI-DNA ANTIBODY, DOUBLE-STRANDED

## 2017-01-23 LAB — HISTONE ANTIBODIES, IGG, BLOOD: Histone Ab: <1 U (ref <1.0)

## 2017-01-23 LAB — ANA: ANA: NEGATIVE

## 2017-01-23 LAB — ANCA SCREEN W REFLEX TITER: ANCA SCREEN: NEGATIVE

## 2017-02-22 ENCOUNTER — Ambulatory Visit: Payer: Self-pay | Admitting: Infectious Disease

## 2017-04-20 ENCOUNTER — Other Ambulatory Visit: Payer: Self-pay | Admitting: Pharmacist

## 2017-08-07 ENCOUNTER — Ambulatory Visit (INDEPENDENT_AMBULATORY_CARE_PROVIDER_SITE_OTHER): Payer: Self-pay | Admitting: Pharmacist Clinician (PhC)/ Clinical Pharmacy Specialist

## 2017-08-07 VITALS — Wt 155.8 lb

## 2017-08-07 DIAGNOSIS — Z23 Encounter for immunization: Secondary | ICD-10-CM

## 2017-08-07 DIAGNOSIS — B182 Chronic viral hepatitis C: Secondary | ICD-10-CM

## 2017-08-07 DIAGNOSIS — B20 Human immunodeficiency virus [HIV] disease: Secondary | ICD-10-CM

## 2017-08-07 MED ORDER — BICTEGRAVIR-EMTRICITAB-TENOFOV 50-200-25 MG PO TABS: 1.0000 | ORAL_TABLET | Freq: Every day | ORAL | 1 refills | Status: DC

## 2017-08-07 MED FILL — BIKTARVY 50-200-25 MG TABS: 50-200-25 | 30 days supply | Qty: 30 | Fill #0

## 2017-08-07 NOTE — Progress Notes (Signed)
HPI: Jason [name] is a 52 y.o. male who is here for his HIV and Hep C visit with pharmacy  Lab Results  Component Value Date   HCVGENOTYPE 1a 11/03/2016    Allergies: Allergies  Allergen Reactions  . Codeine Itching    Vitals:    Past Medical History: Past Medical History:  Diagnosis Date  . AIDS (HCC) 01/18/2016  . Depression 01/18/2016  . Dysphagia 07/19/2016  . Facial rash 03/03/2016  . Hepatitis C   . HIV positive (HCC)   . Inguinal hernia 01/18/2017  . Malar rash 01/18/2017  . Thrush 03/03/2016    Social History: Social History   Socioeconomic History  . Marital status: Single    Spouse name: Not on file  . Number of children: Not on file  . Years of education: Not on file  . Highest education level: Not on file  Occupational History  . Not on file  Social Needs  . Financial resource strain: Not on file  . Food insecurity:    Worry: Not on file    Inability: Not on file  . Transportation needs:    Medical: Not on file    Non-medical: Not on file  Tobacco Use  . Smoking status: Never Smoker  . Smokeless tobacco: Never Used  Substance and Sexual Activity  . Alcohol use: No    Comment: rare  . Drug use: Yes    Types: Marijuana  . Sexual activity: Not on file  Lifestyle  . Physical activity:    Days per week: Not on file    Minutes per session: Not on file  . Stress: Not on file  Relationships  . Social connections:    Talks on phone: Not on file    Gets together: Not on file    Attends religious service: Not on file    Active member of club or organization: Not on file    Attends meetings of clubs or organizations: Not on file    Relationship status: Not on file  Other Topics Concern  . Not on file  Social History Narrative  . Not on file    Labs: HIV 1 RNA Quant (copies/mL)  Date Value  07/06/2016 74 (H)  02/12/2016 4,934 (H)  11/23/2015 829,562 (H)   CD4 T Cell Abs (/uL)  Date Value  11/03/2016 140 (L)  07/06/2016 180 (L)   03/03/2016 300 (L)   Hep B S Ab (no units)  Date Value  11/23/2015 INDETER (A)   Hepatitis B Surface Ag (no units)  Date Value  11/23/2015 NEGATIVE   HCV Ab (no units)  Date Value  11/23/2015 REACTIVE (A)    Lab Results  Component Value Date   HCVGENOTYPE 1a 11/03/2016    Hepatitis C RNA quantitative Latest Ref Rng & Units 11/23/2015  HCV Quantitative <15 IU/mL 13,086,578(I)  HCV Quantitative Log <1.18 log 10 7.10(H)    AST (U/L)  Date Value  11/03/2016 73 (H)  07/06/2016 42 (H)  03/03/2016 55 (H)   ALT (U/L)  Date Value  11/03/2016 51 (H)  07/06/2016 61 (H)  03/03/2016 48 (H)    CrCl: CrCl cannot be calculated (Patient's most recent lab result is older than the maximum 21 days allowed.).  Fibrosis Score: Pending  Child-Pugh Score: Prob class  Previous Treatment Regimen: None for hep C, Prezcobix/Descovy  Current therapy = Biktarvy  Assessment: Jason [name] missed the appt with Dr. Daiva [name] in November of 2018. He took his last Biktarvy yesterday and claimed  to have not missed any doses since then. Of course his ADAP has lapsed and just only renewed last week. This will take a while with their current process. Therefore, we will do [facility] to get temporary supply. He will pick up at [facility] today. His previous CD4 was 140 but he deferred bactrim at the time.   He is also hep C infected with geno 1a. We will have to repeat labs along with his HIV labs today before we can even approach treatment. He will meet with Jason [name] to schedule the elastography today.   He stated that he recently went to [facility] department to be treated for syphilis. He was previously infected and his titer was trending down. He stated that he health department told him that this is a new infection. He said that he had leopard skin pattern on his hands and feet. He was treated there with bicillin x 1 (4/819) and his titer was 1:32.   Recommendations:  All HIV labs  today Hep C labs Continue Biktarvy 1 daily Schedule elastography F/u with Dr. Daiva [name] in July  Jason [name], [place].D., BCPS, AAHIVP Clinical Infectious Disease Pharmacist [facility] 08/07/2017, 11:53 AM

## 2017-08-08 ENCOUNTER — Encounter: Payer: Self-pay | Admitting: Infectious Disease

## 2017-08-08 LAB — COMPLETE METABOLIC PANEL WITH GFR
AG Ratio: 1.3 (calc) (ref 1.0–2.5)
ALBUMIN MSPROF: 4.3 g/dL (ref 3.6–5.1)
ALKALINE PHOSPHATASE (APISO): 83 U/L (ref 40–115)
ALT: 44 U/L (ref 9–46)
AST: 42 U/L — ABNORMAL HIGH (ref 10–35)
BILIRUBIN TOTAL: 0.6 mg/dL (ref 0.2–1.2)
BUN: 15 mg/dL (ref 7–25)
CO2: 23 mmol/L (ref 20–32)
CREATININE: 0.93 mg/dL (ref 0.70–1.33)
Calcium: 9.3 mg/dL (ref 8.6–10.3)
Chloride: 105 mmol/L (ref 98–110)
GFR, Est African American: 109 mL/min/{1.73_m2} (ref >=60)
GFR, Est Non African American: 94 mL/min/{1.73_m2} (ref >=60)
GLUCOSE: 85 mg/dL (ref 65–99)
Globulin: 3.4 g/dL (calc) (ref 1.9–3.7)
Potassium: 4 mmol/L (ref 3.5–5.3)
SODIUM: 137 mmol/L (ref 135–146)
Total Protein: 7.7 g/dL (ref 6.1–8.1)

## 2017-08-08 LAB — T-HELPER CELL (CD4) - (RCID CLINIC ONLY)
CD4 % Helper T Cell: 12 % — ABNORMAL LOW (ref 33–55)
CD4 T CELL ABS: 190 /uL — AB (ref 400–2700)

## 2017-08-08 LAB — URINE CYTOLOGY ANCILLARY ONLY
Chlamydia: NEGATIVE
Neisseria Gonorrhea: NEGATIVE

## 2017-08-08 LAB — CYTOLOGY, (ORAL, ANAL, URETHRAL) ANCILLARY ONLY
CHLAMYDIA, DNA PROBE: NEGATIVE
Chlamydia: NEGATIVE
Neisseria Gonorrhea: NEGATIVE
Neisseria Gonorrhea: NEGATIVE

## 2017-08-08 LAB — HEMOGLOBIN A1C
HEMOGLOBIN A1C: 5.3 %{Hb} (ref <5.7)
Mean Plasma Glucose: 105 (calc)
eAG (mmol/L): 5.8 (calc)

## 2017-08-08 LAB — FLUORESCENT TREPONEMAL AB(FTA)-IGG-BLD: FLUORESCENT TREPONEMAL ABS: REACTIVE — AB

## 2017-08-08 LAB — RPR TITER: RPR Titer: 1:32 {titer} — ABNORMAL HIGH

## 2017-08-08 LAB — PROTIME-INR
INR: 1
Prothrombin Time: 10.9 s (ref 9.0–11.5)

## 2017-08-08 LAB — RPR: RPR: REACTIVE — AB

## 2017-08-09 LAB — HIV-1 RNA QUANT-NO REFLEX-BLD
HIV 1 RNA Quant: <20 copies/mL — AB
HIV-1 RNA QUANT, LOG: DETECTED {Log_copies}/mL — AB

## 2017-08-09 LAB — HEPATITIS C RNA QUANTITATIVE
HCV Quantitative Log: 6.33 Log IU/mL — ABNORMAL HIGH
HCV RNA, PCR, QN: 2160000 IU/mL — ABNORMAL HIGH

## 2017-08-14 ENCOUNTER — Telehealth: Payer: Self-pay

## 2017-08-14 LAB — LIVER FIBROSIS, FIBROTEST-ACTITEST
ALT: 42 U/L (ref 9–46)
Alpha-2-Macroglobulin: 290 mg/dL — ABNORMAL HIGH (ref 106–279)
Apolipoprotein A1: 163 mg/dL (ref 94–176)
BILIRUBIN: 0.5 mg/dL (ref 0.2–1.2)
Fibrosis Score: 0.63
GGT: 42 U/L (ref 3–95)
HAPTOGLOBIN: 22 mg/dL — AB (ref 43–212)
NECROINFLAMMAT ACT SCORE: 0.33
REFERENCE ID: 2470134

## 2017-08-14 NOTE — Telephone Encounter (Signed)
Called pt to see how he's doing on meds. Also to make sure that he if he is sexually active to use condoms to avoid reinfection from Syphillis.  Lorenso Courier, [place]

## 2017-08-14 NOTE — Telephone Encounter (Signed)
-----   Message from Randall Hiss, MD sent at 08/08/2017  5:07 PM EDT ----- Per Silvestre Moment note pt recently treated for syphilis about a few weeks ago so titer is too soon. I am unhappy he has been sexually active while NOT on meds and therefore potentially being even higher risk of spreading HIV with co STI and unctrolled HIV

## 2017-08-15 ENCOUNTER — Ambulatory Visit (HOSPITAL_COMMUNITY)
Admission: RE | Admit: 2017-08-15 | Discharge: 2017-08-15 | Disposition: A | Discharge status: Home / Self Care | Payer: Self-pay | Source: Ambulatory Visit | Attending: Infectious Disease | Admitting: Infectious Disease

## 2017-08-15 ENCOUNTER — Encounter (HOSPITAL_COMMUNITY): Payer: Self-pay

## 2017-08-15 DIAGNOSIS — B182 Chronic viral hepatitis C: Secondary | ICD-10-CM

## 2017-10-11 ENCOUNTER — Ambulatory Visit (INDEPENDENT_AMBULATORY_CARE_PROVIDER_SITE_OTHER): Payer: Self-pay | Admitting: Infectious Disease

## 2017-10-11 ENCOUNTER — Encounter: Payer: Self-pay | Admitting: Infectious Disease

## 2017-10-11 VITALS — Wt 160.0 lb

## 2017-10-11 DIAGNOSIS — M25561 Pain in right knee: Secondary | ICD-10-CM

## 2017-10-11 DIAGNOSIS — R140 Abdominal distension (gaseous): Secondary | ICD-10-CM

## 2017-10-11 DIAGNOSIS — B20 Human immunodeficiency virus [HIV] disease: Secondary | ICD-10-CM

## 2017-10-11 DIAGNOSIS — G8929 Other chronic pain: Secondary | ICD-10-CM

## 2017-10-11 DIAGNOSIS — R21 Rash and other nonspecific skin eruption: Secondary | ICD-10-CM

## 2017-10-11 DIAGNOSIS — B182 Chronic viral hepatitis C: Secondary | ICD-10-CM

## 2017-10-11 HISTORY — DX: Pain in right knee: M25.561

## 2017-10-11 HISTORY — DX: Abdominal distension (gaseous): R14.0

## 2017-10-11 MED ORDER — LEDIPASVIR-SOFOSBUVIR 90-400 MG PO TABS: 1.0000 | ORAL_TABLET | Freq: Every day | ORAL | 2 refills | Status: DC

## 2017-10-11 NOTE — Progress Notes (Signed)
Subjective:   Chief complaint: He has symptoms of some bloating in his abdomen  Patient ID: Jason [name], male    DOB: 02/06/1966, 52 y.o.   MRN: [id number]  HPI  52 year old Caucasian man with HIV, and HCV coinfection, Initially cared for in [place] at [place]. He has some [place] and [place] [facility]. Unfortunately while there he has not been very adherent to his antiretroviral regimen of Prezista NORVIR and Truvada. He was off medicines for several months and decided to change care to [facility]. Most recent viral load with is over 180,000 and his CD4 count is 80.  We put him back on a protease inhibitor based regimen and were going to stared  him back on PREZCOBIX with DESCOVY and use Bactrim for PCP prophylaxis.  He had flare of viremia in the Fall of 2017 that has since come under control when checked on repeat labs. He had been taking Prezcobix and Descocvy but not taking his Bactrim reliably.  He had been diagnosed and treated for syphilis and titers coming down. For the past 6+ months he has had a facial rash that has NOT improved after changing detergents. He is no longer taking bactrim reliably. It was on his cheecks, his nose and forehead but no where else.   He stated that a friend who has SLE let him try a cream on his face that DID help it  He is also have worsening pain from his left sided inguinal hernia that he points out is visible even through his jeans.   We DC his regimen and changed to [facility]  Since then his rash has still persisted though he says it gets better when he is in the sun more.  He claims to be adherent to his antiretrovirals and claims that he is renewed his HMA P last week with Aundra Millet.  He is interested in having his hepatitis C treated.   Past Medical History:  Diagnosis Date  . AIDS (HCC) 01/18/2016  . Depression 01/18/2016  . Dysphagia 07/19/2016  . Facial rash  03/03/2016  . Hepatitis C   . HIV positive (HCC)   . Inguinal hernia 01/18/2017  . Malar rash 01/18/2017  . Thrush 03/03/2016    Past Surgical History:  Procedure Laterality Date  . ESOPHAGOGASTRODUODENOSCOPY (EGD) WITH PROPOFOL N/A 09/10/2016   Procedure: ESOPHAGOGASTRODUODENOSCOPY (EGD) WITH PROPOFOL;  Surgeon: Vida Rigger, MD;  Location: [facility] ENDOSCOPY;  Service: Endoscopy;  Laterality: N/A;    No family history on file.    Social History   Socioeconomic History  . Marital status: Single    Spouse name: Not on file  . Number of children: Not on file  . Years of education: Not on file  . Highest education level: Not on file  Occupational History  . Not on file  Social Needs  . Financial resource strain: Not on file  . Food insecurity:    Worry: Not on file    Inability: Not on file  . Transportation needs:    Medical: Not on file    Non-medical: Not on file  Tobacco Use  . Smoking status: Never Smoker  . Smokeless tobacco: Never Used  Substance and Sexual Activity  . Alcohol use: No    Comment: rare  . Drug use: Yes    Types: Marijuana  . Sexual activity: Not on file  Lifestyle  . Physical activity:    Days  per week: Not on file    Minutes per session: Not on file  . Stress: Not on file  Relationships  . Social connections:    Talks on phone: Not on file    Gets together: Not on file    Attends religious service: Not on file    Active member of club or organization: Not on file    Attends meetings of clubs or organizations: Not on file    Relationship status: Not on file  Other Topics Concern  . Not on file  Social History Narrative  . Not on file    Allergies  Allergen Reactions  . Codeine Itching     Current Outpatient Medications:  .  bictegravir-emtricitabine-tenofovir AF (BIKTARVY) 50-200-25 MG TABS tablet, Take 1 tablet by mouth daily., Disp: 30 tablet, Rfl: 11 .  bictegravir-emtricitabine-tenofovir AF (BIKTARVY) 50-200-25 MG TABS tablet, Take  1 tablet by mouth daily., Disp: 30 tablet, Rfl: 1   Review of Systems  Constitutional: Negative for chills and fever.  HENT: Negative for congestion.   Eyes: Negative for photophobia.  Respiratory: Negative for cough, shortness of breath and wheezing.   Cardiovascular: Negative for chest pain, palpitations and leg swelling.  Gastrointestinal: Negative for abdominal distention, abdominal pain, blood in stool, constipation, diarrhea, nausea and vomiting.  Genitourinary: Negative for dysuria, flank pain and hematuria.  Musculoskeletal: Negative for back pain and myalgias.  Skin: Positive for rash.  Neurological: Negative for dizziness, weakness and headaches.  Hematological: Does not bruise/bleed easily.  Psychiatric/Behavioral: Negative for decreased concentration, dysphoric mood, self-injury, sleep disturbance and suicidal ideas. The patient is not nervous/anxious.        Objective:   Physical Exam  Constitutional: He is oriented to person, place, and time. He appears well-developed and well-nourished. No distress.  HENT:  Head: Normocephalic and atraumatic.  Mouth/Throat: Uvula is midline and oropharynx is clear and moist. No oropharyngeal exudate.  Eyes: Conjunctivae and EOM are normal. No scleral icterus.  Neck: Normal range of motion. Neck supple.  Cardiovascular: Normal rate and regular rhythm.  Pulmonary/Chest: Effort normal. No respiratory distress. He has no wheezes.  Abdominal: He exhibits no distension. A hernia is present.  Genitourinary:     Musculoskeletal: He exhibits no edema or tenderness.  Neurological: He is alert and oriented to person, place, and time. He exhibits normal muscle tone. Coordination normal.  Skin: Skin is warm and dry. No rash noted. He is not diaphoretic. No erythema. No pallor.     Psychiatric: He has a normal mood and affect. His speech is normal and behavior is normal. Judgment and thought content normal. Cognition and memory are normal.    Nursing note and vitals reviewed.   Malar rash 01/18/17:     Rash today October 11, 2017:          Assessment & Plan:   HIV/AIDS: Continue BIKTARVY and check labs today.  Rash: Rash does not seem to be related to his prior antiretroviral regimen but it is not severe at present  Chronic hepatitis C without hepatic coma: We will prescribe him Harvoni for 3 months and he will follow-up with infectious disease pharmacy in 1 month.  Prior substance abuse: He has a past history significant for intravenous drug use but denies using intravenous drugs for many years and states that the only drug but he does uses marijuana.  Bloating : I doubt this is related to Biktarvy  I spent greater than 25 minutes with the patient including greater than 50%  of time in face to face counsel of the patient regarding how we would treat his hepatitis C importance of being adherent to these medications in addition to his BIKTARVY and in coordination of his care.

## 2017-10-11 NOTE — Progress Notes (Signed)
HPI: Jason [name] is a 52 y.o. male who is here to see [name] for his HIV/hep C follow up.  Lab Results  Component Value Date   HCVGENOTYPE 1a 11/03/2016    Allergies: Allergies  Allergen Reactions  . Codeine Itching    Vitals:    Past Medical History: Past Medical History:  Diagnosis Date  . AIDS (HCC) 01/18/2016  . Bloating symptom 10/11/2017  . Depression 01/18/2016  . Dysphagia 07/19/2016  . Facial rash 03/03/2016  . Hepatitis C   . HIV positive (HCC)   . Inguinal hernia 01/18/2017  . Malar rash 01/18/2017  . Right knee pain 10/11/2017  . Thrush 03/03/2016    Social History: Social History   Socioeconomic History  . Marital status: Single    Spouse name: Not on file  . Number of children: Not on file  . Years of education: Not on file  . Highest education level: Not on file  Occupational History  . Not on file  Social Needs  . Financial resource strain: Not on file  . Food insecurity:    Worry: Not on file    Inability: Not on file  . Transportation needs:    Medical: Not on file    Non-medical: Not on file  Tobacco Use  . Smoking status: Never Smoker  . Smokeless tobacco: Never Used  Substance and Sexual Activity  . Alcohol use: No    Comment: rare  . Drug use: Yes    Types: Marijuana  . Sexual activity: Not on file  Lifestyle  . Physical activity:    Days per week: Not on file    Minutes per session: Not on file  . Stress: Not on file  Relationships  . Social connections:    Talks on phone: Not on file    Gets together: Not on file    Attends religious service: Not on file    Active member of club or organization: Not on file    Attends meetings of clubs or organizations: Not on file    Relationship status: Not on file  Other Topics Concern  . Not on file  Social History Narrative  . Not on file    Labs: HIV 1 RNA Quant (copies/mL)  Date Value  08/07/2017 <20 DETECTED (A)  07/06/2016 74 (H)  02/12/2016 4,934 (H)   CD4 T Cell  Abs (/uL)  Date Value  08/07/2017 190 (L)  11/03/2016 140 (L)  07/06/2016 180 (L)   Hep B S Ab (no units)  Date Value  11/23/2015 INDETER (A)   Hepatitis B Surface Ag (no units)  Date Value  11/23/2015 NEGATIVE   HCV Ab (no units)  Date Value  11/23/2015 REACTIVE (A)    Lab Results  Component Value Date   HCVGENOTYPE 1a 11/03/2016    Hepatitis C RNA quantitative Latest Ref Rng & Units 08/07/2017 11/23/2015  HCV Quantitative <15 IU/mL - 96,045,409(W)  HCV Quantitative Log NOT DETECT Log IU/mL 6.33(H) 7.10(H)    AST (U/L)  Date Value  08/07/2017 42 (H)  11/03/2016 73 (H)  07/06/2016 42 (H)   ALT (U/L)  Date Value  08/07/2017 44  08/07/2017 42  11/03/2016 51 (H)  07/06/2016 61 (H)   INR (no units)  Date Value  08/07/2017 1.0    CrCl: CrCl cannot be calculated (Patient's most recent lab result is older than the maximum 21 days allowed.).  Fibrosis Score: F3 as assessed by Fibrosure  Child-Pugh Score: Class A  Previous Treatment Regimen: None  Assessment: Fraser Dinreston is co-infected with HIV and hep C. 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 He was supposed to have an US done but he had a coke that day so it was canceled.   Recommendations:   [place] Pham, [place]uzan Nailer., BCIDP, AAHIVP Clinical Infectious Disease Pharmacist [facility] 10/11/2017, 11:49 AM

## 2017-10-12 LAB — CBC WITH DIFFERENTIAL/PLATELET
BASOS PCT: 0.4 %
Basophils Absolute: 29 cells/uL (ref 0–200)
Eosinophils Absolute: 130 cells/uL (ref 15–500)
Eosinophils Relative: 1.8 %
HCT: 45.8 % (ref 38.5–50.0)
HEMOGLOBIN: 15.4 g/dL (ref 13.2–17.1)
Lymphs Abs: 1541 cells/uL (ref 850–3900)
MCH: 30.6 pg (ref 27.0–33.0)
MCHC: 33.6 g/dL (ref 32.0–36.0)
MCV: 91.1 fL (ref 80.0–100.0)
MONOS PCT: 11.5 %
MPV: 9.8 fL (ref 7.5–12.5)
NEUTROS ABS: 4673 {cells}/uL (ref 1500–7800)
Neutrophils Relative %: 64.9 %
Platelets: 302 10*3/uL (ref 140–400)
RBC: 5.03 10*6/uL (ref 4.20–5.80)
RDW: 13.1 % (ref 11.0–15.0)
Total Lymphocyte: 21.4 %
WBC: 7.2 10*3/uL (ref 3.8–10.8)
WBCMIX: 828 {cells}/uL (ref 200–950)

## 2017-10-12 LAB — URINE CYTOLOGY ANCILLARY ONLY
Chlamydia: NEGATIVE
Neisseria Gonorrhea: NEGATIVE

## 2017-10-12 LAB — LIPID PANEL
CHOL/HDL RATIO: 3.3 (calc) (ref <5.0)
Cholesterol: 183 mg/dL (ref <200)
HDL: 56 mg/dL (ref >40)
LDL Cholesterol (Calc): 100 mg/dL (calc) — ABNORMAL HIGH
NON-HDL CHOLESTEROL (CALC): 127 mg/dL (ref <130)
Triglycerides: 169 mg/dL — ABNORMAL HIGH (ref <150)

## 2017-10-12 LAB — COMPLETE METABOLIC PANEL WITH GFR
AG Ratio: 1.3 (calc) (ref 1.0–2.5)
ALBUMIN MSPROF: 4.4 g/dL (ref 3.6–5.1)
ALT: 37 U/L (ref 9–46)
AST: 36 U/L — ABNORMAL HIGH (ref 10–35)
Alkaline phosphatase (APISO): 76 U/L (ref 40–115)
BILIRUBIN TOTAL: 0.6 mg/dL (ref 0.2–1.2)
BUN: 15 mg/dL (ref 7–25)
CO2: 22 mmol/L (ref 20–32)
CREATININE: 0.91 mg/dL (ref 0.70–1.33)
Calcium: 9.5 mg/dL (ref 8.6–10.3)
Chloride: 104 mmol/L (ref 98–110)
GFR, EST AFRICAN AMERICAN: 112 mL/min/{1.73_m2} (ref >=60)
GFR, Est Non African American: 97 mL/min/{1.73_m2} (ref >=60)
Globulin: 3.4 g/dL (calc) (ref 1.9–3.7)
Glucose, Bld: 90 mg/dL (ref 65–99)
Potassium: 4.2 mmol/L (ref 3.5–5.3)
Sodium: 137 mmol/L (ref 135–146)
TOTAL PROTEIN: 7.8 g/dL (ref 6.1–8.1)

## 2017-10-12 LAB — T-HELPER CELL (CD4) - (RCID CLINIC ONLY)
CD4 T CELL HELPER: 9 % — AB (ref 33–55)
CD4 T Cell Abs: 150 /uL — ABNORMAL LOW (ref 400–2700)

## 2017-10-12 LAB — RPR TITER

## 2017-10-12 LAB — RPR: RPR Ser Ql: REACTIVE — AB

## 2017-10-12 LAB — FLUORESCENT TREPONEMAL AB(FTA)-IGG-BLD: FLUORESCENT TREPONEMAL ABS: REACTIVE — AB

## 2017-10-15 LAB — HIV RNA, RTPCR W/R GT (RTI, PI,INT)
HIV 1 RNA Quant: <20 copies/mL
HIV-1 RNA Quant, Log: <1.3 Log copies/mL

## 2017-11-01 ENCOUNTER — Ambulatory Visit: Payer: Self-pay

## 2017-11-02 ENCOUNTER — Encounter: Payer: Self-pay | Admitting: Infectious Disease

## 2017-11-02 ENCOUNTER — Encounter: Payer: Self-pay | Admitting: Pharmacy Technician

## 2017-11-06 ENCOUNTER — Telehealth: Payer: Self-pay | Admitting: *Deleted

## 2017-11-06 NOTE — Telephone Encounter (Signed)
Patient started Harvoni 8/5. He will come 9/3 for counseling with Rene Kocheregina and for pharmacy follow up (ok to overbook per Cassie). Andree CossHowell, Vanessa Kampf M, RN

## 2017-11-14 ENCOUNTER — Ambulatory Visit: Payer: Self-pay | Admitting: Licensed Clinical Social Worker

## 2017-11-28 ENCOUNTER — Ambulatory Visit: Payer: Self-pay | Admitting: Licensed Clinical Social Worker

## 2017-11-28 ENCOUNTER — Other Ambulatory Visit: Payer: Self-pay

## 2017-11-28 ENCOUNTER — Ambulatory Visit (INDEPENDENT_AMBULATORY_CARE_PROVIDER_SITE_OTHER): Payer: Self-pay | Admitting: Pharmacist

## 2017-11-28 DIAGNOSIS — R110 Nausea: Secondary | ICD-10-CM

## 2017-11-28 DIAGNOSIS — B182 Chronic viral hepatitis C: Secondary | ICD-10-CM

## 2017-11-28 DIAGNOSIS — B20 Human immunodeficiency virus [HIV] disease: Secondary | ICD-10-CM

## 2017-11-28 MED ORDER — ONDANSETRON 4 MG PO TBDP: 4.0000 mg | ORAL_TABLET | Freq: Three times a day (TID) | ORAL | 0 refills | Status: DC | PRN

## 2017-11-28 NOTE — Progress Notes (Signed)
HPI: Jason [name] is a 52 y.o. male who presents to the [facility] pharmacy clinic for HIV and Hep C follow-up.   Medication: Harvoni x 12 weeks  Start Date: 8/4  Hepatitis C Genotype: 1a  Fibrosis Score: F3  Hepatitis C RNA: 2.1 million  Patient Active Problem List   Diagnosis Date Noted  . Right knee pain 10/11/2017  . Bloating symptom 10/11/2017  . Malar rash 01/18/2017  . Inguinal hernia 01/18/2017  . Food impaction of esophagus 09/10/2016  . Dysphagia 07/19/2016  . Facial rash 03/03/2016  . Thrush 03/03/2016  . AIDS (HCC) 01/18/2016  . Depression 01/18/2016  . Dog bite of left calf 12/14/2014  . 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 Chronic hepatitis C without hepatic coma (HCC) 05/01/2014  . Human immunodeficiency virus (HIV) disease (HCC) 12/09/2013    Patient's Medications  New Prescriptions   ONDANSETRON (ZOFRAN ODT) 4 MG DISINTEGRATING TABLET    Take 1 tablet (4 mg total) by mouth every 8 (eight) hours as needed for nausea or vomiting.  Previous Medications   BICTEGRAVIR-EMTRICITABINE-TENOFOVIR AF (BIKTARVY) 50-200-25 MG TABS TABLET    Take 1 tablet by mouth daily.   BICTEGRAVIR-EMTRICITABINE-TENOFOVIR AF (BIKTARVY) 50-200-25 MG TABS TABLET    Take 1 tablet by mouth daily.   LEDIPASVIR-SOFOSBUVIR (HARVONI) 90-400 MG TABS    Take 1 tablet by mouth daily.  Modified Medications   No medications on file  Discontinued Medications   No medications on file    Allergies: Allergies  Allergen Reactions  . Codeine Itching    Past Medical History: Past Medical History:  Diagnosis Date  . AIDS (HCC) 01/18/2016  . Bloating symptom 10/11/2017  . Depression 01/18/2016  . Dysphagia 07/19/2016  . Facial rash 03/03/2016  . Hepatitis C   . HIV positive (HCC)   . Inguinal hernia 01/18/2017  . Malar rash 01/18/2017  . Right knee pain 10/11/2017  . Thrush 03/03/2016    Social History: Social History    Socioeconomic History  . Marital status: Single    Spouse name: Not on file  . Number of children: Not on file  . Years of education: Not on file  . Highest education level: Not on file  Occupational History  . Not on file  Social Needs  . Financial resource strain: Not on file  . Food insecurity:    Worry: Not on file    Inability: Not on file  . Transportation needs:    Medical: Not on file    Non-medical: Not on file  Tobacco Use  . Smoking status: Never Smoker  . Smokeless tobacco: Never Used  Substance and Sexual Activity  . Alcohol use: No    Comment: rare  . Drug use: Yes    Types: Marijuana  . Sexual activity: Not on file  Lifestyle  . Physical activity:    Days per week: Not on file    Minutes per session: Not on file  . Stress: Not on file  Relationships  . Social connections:    Talks on phone: Not on file    Gets together: Not on file    Attends religious service: Not on file    Active member of club or organization: Not on file    Attends meetings of clubs or organizations: Not on file    Relationship status: Not on file  Other Topics Concern  . Not on file  Social History Narrative  . Not  on file    Labs: Hepatitis C Lab Results  Component Value Date   HCVGENOTYPE 1a 11/03/2016   HCVRNAPCRQN 2,160,000 (H) 08/07/2017   FIBROSTAGE F3 08/07/2017   Hepatitis B Lab Results  Component Value Date   HEPBSAB INDETER (A) 11/23/2015   HEPBSAG NEGATIVE 11/23/2015   HEPBCAB REACTIVE (A) 11/23/2015   Hepatitis A Lab Results  Component Value Date   HAV NON REACTIVE 11/23/2015   HIV No results found for: HIV Lab Results  Component Value Date   CREATININE 0.91 10/11/2017   CREATININE 0.93 08/07/2017   CREATININE 0.94 11/03/2016   CREATININE 0.91 07/06/2016   CREATININE 1.01 03/03/2016   Lab Results  Component Value Date   AST 36 (H) 10/11/2017   AST 42 (H) 08/07/2017   AST 73 (H) 11/03/2016   ALT 37 10/11/2017   ALT 44 08/07/2017   ALT  42 08/07/2017   INR 1.0 08/07/2017    Assessment: Jason [name] is here today to follow-up for his HIV and Hep C co-infection. He started 12 weeks of Harvoni about a month ago.  He states he is doing well on the medication and has missed no doses.  He did say that he could not remember if he took it one day, so he may have taken 2 that day. He takes it at the same time as his Biktarvy.  He is experiencing the usual side effects of DAAs including some nausea, headaches, and fatigue.  He takes ibuprofen as needed for headaches.  I will send in some Zofran for him to pre-treat with but it does help when he takes it with food.  He states he is so fatigued that he has to take a nap at work sometimes.  I told him that the side effects usually get better after the first month and to power through.  No other issues.  Will check a Hep C viral load and HIV viral load today.   He is asking about his Hep B status.  It looks like his Hep B core antibody was positive and his Hep B surface antibody was indeterminate.  Will recheck antibody and if still indeterminate, will give him a booster vaccine.  He was supposed to see Jason [name] today but she is out of the office.  Rescheduled him for mid- September as he states taking so many medications is reminding him of his past choices and making him depressed.   Plan: - Continue Harvoni x 12 weeks - Continue Biktarvy PO once daily - Hep C VL, HIV VL, CMET, Hep B surf ab today - Zofran 4 mg ODT q8h PRN n/v - F/u with Dr. Daiva [name] 11/20 at 915am  Jason [name], PharmD, AAHIVP, CPP Infectious Diseases Clinical Pharmacist [facility] 11/28/2017, 4:04 PM

## 2017-11-30 LAB — COMPREHENSIVE METABOLIC PANEL
AG RATIO: 1.2 (calc) (ref 1.0–2.5)
ALT: 14 U/L (ref 9–46)
AST: 17 U/L (ref 10–35)
Albumin: 4.2 g/dL (ref 3.6–5.1)
Alkaline phosphatase (APISO): 77 U/L (ref 40–115)
BUN: 17 mg/dL (ref 7–25)
CO2: 26 mmol/L (ref 20–32)
Calcium: 9.5 mg/dL (ref 8.6–10.3)
Chloride: 104 mmol/L (ref 98–110)
Creat: 1.09 mg/dL (ref 0.70–1.33)
GLUCOSE: 109 mg/dL — AB (ref 65–99)
Globulin: 3.5 g/dL (calc) (ref 1.9–3.7)
Potassium: 4.4 mmol/L (ref 3.5–5.3)
Sodium: 137 mmol/L (ref 135–146)
TOTAL PROTEIN: 7.7 g/dL (ref 6.1–8.1)
Total Bilirubin: 0.4 mg/dL (ref 0.2–1.2)

## 2017-11-30 LAB — HIV-1 RNA QUANT-NO REFLEX-BLD
HIV 1 RNA Quant: <20 copies/mL
HIV-1 RNA Quant, Log: <1.3 Log copies/mL

## 2017-11-30 LAB — HEPATITIS C RNA QUANTITATIVE
HCV QUANT LOG: NOT DETECTED {Log_IU}/mL
HCV RNA, PCR, QN: <15 IU/mL

## 2017-11-30 LAB — HEPATITIS B SURFACE ANTIBODY,QUALITATIVE: Hep B S Ab: BORDERLINE — AB

## 2017-12-06 ENCOUNTER — Ambulatory Visit: Payer: Self-pay

## 2017-12-12 ENCOUNTER — Ambulatory Visit (INDEPENDENT_AMBULATORY_CARE_PROVIDER_SITE_OTHER): Payer: Self-pay | Admitting: Licensed Clinical Social Worker

## 2017-12-12 DIAGNOSIS — F411 Generalized anxiety disorder: Secondary | ICD-10-CM

## 2017-12-13 NOTE — BH Specialist Note (Signed)
Integrated Behavioral Health Initial Visit  MRN: [id number] Name: Jason Sheehanreston Duet  Number of Integrated Behavioral Health Clinician visits:: 1/6 Session Start time: 1:30pm  Session End time: 2:00pm Total time: 30 minutes  Type of Service: Integrated Behavioral Health- Individual/Family Interpretor:No. Interpretor Name and Language: n/a  SUBJECTIVE: Jason [name] is a 52 y.o. male accompanied by self Patient self-referred for anxiety. Patient reports the following symptoms/concerns: nervousness/anxiety, relationship problems, cannot stop worrying even when he knows it is irrational, difficulty sleeping due to worry/anxiety Duration of problem: several months; Severity of problem: moderate  OBJECTIVE: Mood: Anxious and Affect: restless Risk of harm to self or others: No plan to harm self or others  LIFE CONTEXT: Patient presents as extremely nervous and fidgety. He reports problems in relationship with partner of 4 years, who is always telling patient he is selfish. Patient and partner live tog[place], patient's family is in [place] but they do not have close relationship. He reports that his family says they are okay with his sexual orientation, but only one cousin has actually shown this through his actions. Patient has one sister, she is married but separates from her husband and moves back in with parents regularly. He has had 2 other long term relationships; partner of 12 years left him a year after [place] and later died of cancer, other former partner of 8+ years died of cancer while they were together (traumatic loss). Patient reports that his current partner has cancer now for the third time. Patient works at [employer] in [place] and reports working about 15 hours/day for 5 days of the week. This job is very stressful for him.   GOALS ADDRESSED: Patient will: 1. Reduce symptoms of: anxiety  INTERVENTIONS: Interventions utilized: Motivational  Interviewing, Brief CBT and Supportive Counseling   ASSESSMENT: Patient currently experiencing physical and cognitive anxiety symptoms: trembling/shakiness, intrusive/obsessive worry thoughts, fatigue, inability to calm his mind to get to sleep, desire to remain active to keep from being alone with his thoughts. His worries are not relative to any specific area of life or trigger. The most congruent diagnosis for the symptoms he endorses is Generalized Anxiety Disorder. Counselor explored with patient the losses he has experienced, and how these have impacted his anxiety. 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 He reports that he used one time in the last year, following a "mental breakdown", and has no intent to begin using again. Patient's second long-term partner was diagnosed with cancer and died 3 days later. He reports that he told partner it was okay to go, and this was the hardest thing he has ever had to do. Counselor processed with patient his experience with partner's death. Patient discussed the trauma of finding out partner's diagnosis, taking him to the hospital that same day and him being in a coma the next day, as well as dealing with partner's family after the death and moving on with his life since then. Counselor validated patient's feelings about his experience with this loss, and pointed out to him that often traumatic grief is triggered throughout one's life. Counselor explored with patient how current partner's struggle with cancer triggers that loss. Patient shared that he wants to be with partner all the time, which partner says is selfish because partner needs some time to himself. He identified that he not only wants to be with partner all the time, but does not  want to be alone because his thoughts are so troublesome. Patient describes being home alone and becoming overwhelmed with  thoughts of what partner may be doing, though he knows that none of them are true. Counselor educated patient on the ways in which chronic stress and trauma impact the brain, and normalized his worry thoughts in this context. Patient became visibly less anxious as the session continued, especially after receiving education about anxiety.    Patient may benefit from weekly CBT to address anxiety and develop coping routine.  PLAN: 1. Follow up with behavioral health clinician on : 12/19/17.  Angus Palms, LCSW

## 2018-01-09 ENCOUNTER — Institutional Professional Consult (permissible substitution): Payer: Self-pay | Admitting: Licensed Clinical Social Worker

## 2018-01-31 ENCOUNTER — Other Ambulatory Visit: Payer: Self-pay

## 2018-01-31 DIAGNOSIS — B20 Human immunodeficiency virus [HIV] disease: Secondary | ICD-10-CM

## 2018-01-31 DIAGNOSIS — B182 Chronic viral hepatitis C: Secondary | ICD-10-CM

## 2018-02-01 LAB — T-HELPER CELL (CD4) - (RCID CLINIC ONLY)
CD4 % Helper T Cell: 11 % — ABNORMAL LOW (ref 33–55)
CD4 T Cell Abs: 130 /uL — ABNORMAL LOW (ref 400–2700)

## 2018-02-01 LAB — URINE CYTOLOGY ANCILLARY ONLY
CHLAMYDIA, DNA PROBE: NEGATIVE
Neisseria Gonorrhea: NEGATIVE

## 2018-02-02 LAB — CBC WITH DIFFERENTIAL/PLATELET
BASOS PCT: 0.4 %
Basophils Absolute: 21 cells/uL (ref 0–200)
EOS PCT: 3.1 %
Eosinophils Absolute: 161 cells/uL (ref 15–500)
HCT: 42.7 % (ref 38.5–50.0)
HEMOGLOBIN: 14.7 g/dL (ref 13.2–17.1)
Lymphs Abs: 1139 cells/uL (ref 850–3900)
MCH: 31.6 pg (ref 27.0–33.0)
MCHC: 34.4 g/dL (ref 32.0–36.0)
MCV: 91.8 fL (ref 80.0–100.0)
MONOS PCT: 11.5 %
MPV: 9.8 fL (ref 7.5–12.5)
NEUTROS ABS: 3281 {cells}/uL (ref 1500–7800)
Neutrophils Relative %: 63.1 %
Platelets: 266 10*3/uL (ref 140–400)
RBC: 4.65 10*6/uL (ref 4.20–5.80)
RDW: 12.5 % (ref 11.0–15.0)
Total Lymphocyte: 21.9 %
WBC mixed population: 598 cells/uL (ref 200–950)
WBC: 5.2 10*3/uL (ref 3.8–10.8)

## 2018-02-02 LAB — COMPLETE METABOLIC PANEL WITH GFR
AG Ratio: 1.3 (calc) (ref 1.0–2.5)
ALKALINE PHOSPHATASE (APISO): 79 U/L (ref 40–115)
ALT: 14 U/L (ref 9–46)
AST: 18 U/L (ref 10–35)
Albumin: 4.1 g/dL (ref 3.6–5.1)
BILIRUBIN TOTAL: 0.5 mg/dL (ref 0.2–1.2)
BUN: 9 mg/dL (ref 7–25)
CHLORIDE: 106 mmol/L (ref 98–110)
CO2: 22 mmol/L (ref 20–32)
Calcium: 8.9 mg/dL (ref 8.6–10.3)
Creat: 0.89 mg/dL (ref 0.70–1.33)
GFR, EST AFRICAN AMERICAN: 113 mL/min/{1.73_m2} (ref >=60)
GFR, Est Non African American: 98 mL/min/{1.73_m2} (ref >=60)
Globulin: 3.2 g/dL (calc) (ref 1.9–3.7)
Glucose, Bld: 107 mg/dL — ABNORMAL HIGH (ref 65–99)
Potassium: 3.8 mmol/L (ref 3.5–5.3)
Sodium: 138 mmol/L (ref 135–146)
TOTAL PROTEIN: 7.3 g/dL (ref 6.1–8.1)

## 2018-02-02 LAB — LIPID PANEL
CHOL/HDL RATIO: 4.2 (calc) (ref <5.0)
Cholesterol: 178 mg/dL (ref <200)
HDL: 42 mg/dL (ref >40)
LDL CHOLESTEROL (CALC): 115 mg/dL — AB
Non-HDL Cholesterol (Calc): 136 mg/dL (calc) — ABNORMAL HIGH (ref <130)
TRIGLYCERIDES: 103 mg/dL (ref <150)

## 2018-02-02 LAB — RPR: RPR: REACTIVE — AB

## 2018-02-02 LAB — FLUORESCENT TREPONEMAL AB(FTA)-IGG-BLD: Fluorescent Treponemal ABS: REACTIVE — AB

## 2018-02-02 LAB — RPR TITER: RPR Titer: 1:8 {titer} — ABNORMAL HIGH

## 2018-02-02 LAB — HIV-1 RNA QUANT-NO REFLEX-BLD
HIV 1 RNA QUANT: 20 {copies}/mL — AB
HIV-1 RNA QUANT, LOG: 1.3 {Log_copies}/mL — AB

## 2018-02-14 ENCOUNTER — Ambulatory Visit (INDEPENDENT_AMBULATORY_CARE_PROVIDER_SITE_OTHER): Payer: Self-pay | Admitting: Infectious Disease

## 2018-02-14 ENCOUNTER — Encounter: Payer: Self-pay | Admitting: Infectious Disease

## 2018-02-14 VITALS — BP 119/80 | HR 60 | Temp 97.6°F | Wt 167.0 lb

## 2018-02-14 DIAGNOSIS — R635 Abnormal weight gain: Secondary | ICD-10-CM

## 2018-02-14 DIAGNOSIS — Z23 Encounter for immunization: Secondary | ICD-10-CM

## 2018-02-14 DIAGNOSIS — B182 Chronic viral hepatitis C: Secondary | ICD-10-CM

## 2018-02-14 DIAGNOSIS — B20 Human immunodeficiency virus [HIV] disease: Secondary | ICD-10-CM

## 2018-02-14 DIAGNOSIS — R632 Polyphagia: Secondary | ICD-10-CM

## 2018-02-14 MED ORDER — DARUN-COBIC-EMTRICIT-TENOFAF 800-150-200-10 MG PO TABS: 1.0000 | ORAL_TABLET | Freq: Every day | ORAL | 11 refills | Status: DC

## 2018-02-14 NOTE — Progress Notes (Signed)
Subjective:   Chief complaint: He is complaining of some weight gain is experienced since being changed to Biktarvy   Patient ID: Jason SheehanPreston Schneider, male    DOB: [birth year], 52 y.o.   MRN: [id number]  HPI  52 year old Caucasian man with HIV, and HCV coinfection, [place]. [place] [facility].  He had been on PREZCOBIX and Truvada which we changed to [facility] to better accommodate treatment of his chronic hepatitis C.  He has finished 3 months of Harvoni roughly 1 month ago.  He largely likes the BIKTARVY is a 1 pill once a day but when I asked him about weight gain he said that he gained a lot of weight and already 10 pounds in the first several months.  I told him that we did not sure of why they were more weight gain in patients on integrase strand transfer inhibitors but there are multiple series.  He told me that he himself has felt that his appetite is been much higher on this medication and abnormally high.  That is certainly 1 of the series and I think if since the patient feels so strongly it is reasonable to change him back off of an integrase strand transfer inhibitors and back on to a protease inhibitor.   Past Medical History:  Diagnosis Date  . AIDS (HCC) 01/18/2016  . Bloating symptom 10/11/2017  . Depression 01/18/2016  . Dysphagia 07/19/2016  . Facial rash 03/03/2016  . Hepatitis C   . HIV positive (HCC)   . Inguinal hernia 01/18/2017  . Malar rash 01/18/2017  . Right knee pain 10/11/2017  . Thrush 03/03/2016    Past Surgical History:  Procedure Laterality Date  . ESOPHAGOGASTRODUODENOSCOPY (EGD) WITH PROPOFOL N/A 09/10/2016   Procedure: ESOPHAGOGASTRODUODENOSCOPY (EGD) WITH PROPOFOL;  Surgeon: Vida RiggerMagod, Marc, MD;  [facility];  Service: Endoscopy;  Laterality: N/A;    No family history on file.    Social History   Socioeconomic  History  . Marital status: Single    Spouse name: Not on file  . Number of children: Not on file  . Years of education: Not on file  . Highest education level: Not on file  Occupational History  . Not on file  Social Needs  . Financial resource strain: Not on file  . Food insecurity:    Worry: Not on file    Inability: Not on file  . Transportation needs:    Medical: Not on file    Non-medical: Not on file  Tobacco Use  . Smoking status: Never Smoker  . Smokeless tobacco: Never Used  Substance and Sexual Activity  . Alcohol use: No    Comment: rare  . Drug use: Yes    Types: Marijuana  . Sexual activity: Not on file  Lifestyle  . Physical activity:    Days per week: Not on file    Minutes per session: Not on file  . Stress: Not on file  Relationships  . Social connections:    Talks on phone: Not on file    Gets together: Not on file    Attends religious service: Not on file    Active member of club or organization: Not on file    Attends meetings of clubs or organizations: Not on file    Relationship status: Not on file  Other Topics Concern  . Not  on file  Social History Narrative  . Not on file    Allergies  Allergen Reactions  . Codeine Itching     Current Outpatient Medications:  .  bictegravir-emtricitabine-tenofovir AF (BIKTARVY) 50-200-25 MG TABS tablet, Take 1 tablet by mouth daily., Disp: 30 tablet, Rfl: 1 .  ondansetron (ZOFRAN ODT) 4 MG disintegrating tablet, Take 1 tablet (4 mg total) by mouth every 8 (eight) hours as needed for nausea or vomiting., Disp: 20 tablet, Rfl: 0 .  Ledipasvir-Sofosbuvir (HARVONI) 90-400 MG TABS, Take 1 tablet by mouth daily. (Patient not taking: Reported on 02/14/2018), Disp: 30 tablet, Rfl: 2   Review of Systems  Constitutional: Positive for appetite change and unexpected weight change. Negative for chills and fever.  HENT: Negative for congestion.   Eyes: Negative for photophobia.  Respiratory: Negative for cough,  shortness of breath and wheezing.   Cardiovascular: Negative for chest pain, palpitations and leg swelling.  Gastrointestinal: Negative for abdominal distention, abdominal pain, blood in stool, constipation, diarrhea, nausea and vomiting.  Genitourinary: Negative for dysuria, flank pain and hematuria.  Musculoskeletal: Negative for back pain and myalgias.  Neurological: Negative for dizziness, weakness and headaches.  Hematological: Does not bruise/bleed easily.  Psychiatric/Behavioral: Negative for decreased concentration, dysphoric mood, self-injury, sleep disturbance and suicidal ideas. The patient is not nervous/anxious.        Objective:   Physical Exam  Constitutional: He is oriented to person, place, and time. He appears well-developed and well-nourished. No distress.  HENT:  Head: Normocephalic and atraumatic.  Mouth/Throat: Uvula is midline and oropharynx is clear and moist. No oropharyngeal exudate.  Eyes: Conjunctivae and EOM are normal. No scleral icterus.  Neck: Normal range of motion. Neck supple.  Cardiovascular: Normal rate and regular rhythm.  Pulmonary/Chest: Effort normal. No respiratory distress. He has no wheezes.  Abdominal: Soft. He exhibits no distension. A hernia is present.  Musculoskeletal: Normal range of motion. He exhibits no edema or tenderness.  Neurological: He is alert and oriented to person, place, and time. He exhibits normal muscle tone. Coordination normal.  Skin: Skin is warm and dry. No rash noted. He is not diaphoretic. No erythema. No pallor.     Psychiatric: He has a normal mood and affect. His speech is normal and behavior is normal. Judgment and thought content normal. Cognition and memory are normal.  Nursing note and vitals reviewed.    Assessment & Plan:   HIV/AIDS: Given weight gain in the context of perceived increase in appetite on BIKTARVY we will switch him back to [facility].  I will check his labs in 3 months time  Rash: Rash  does not seem to be related to his prior antiretroviral regimen and appears quiescent  Chronic hepatitis C without hepatic coma: Check an SVR 12 in 2 months time  I spent greater than 25 minutes with the patient including greater than 50% of time in face to face counsel of the patient adding different antiretroviral as he has been on and how we can now put him on co-formulated drug that is a protease inhibitor to see if this aggregates his weight gain and how we also will follow his chronic hepatitis C and in coordination of his care.

## 2018-05-01 ENCOUNTER — Other Ambulatory Visit: Payer: Self-pay

## 2018-05-01 DIAGNOSIS — B20 Human immunodeficiency virus [HIV] disease: Secondary | ICD-10-CM

## 2018-05-02 LAB — T-HELPER CELL (CD4) - (RCID CLINIC ONLY)
CD4 T CELL ABS: 150 /uL — AB (ref 400–2700)
CD4 T CELL HELPER: 11 % — AB (ref 33–55)

## 2018-05-03 LAB — HIV-1 RNA QUANT-NO REFLEX-BLD
HIV 1 RNA Quant: <20 copies/mL
HIV-1 RNA Quant, Log: <1.3 Log copies/mL

## 2018-05-03 LAB — COMPLETE METABOLIC PANEL WITH GFR
AG RATIO: 1.3 (calc) (ref 1.0–2.5)
ALKALINE PHOSPHATASE (APISO): 81 U/L (ref 35–144)
ALT: 16 U/L (ref 9–46)
AST: 20 U/L (ref 10–35)
Albumin: 4 g/dL (ref 3.6–5.1)
BUN: 11 mg/dL (ref 7–25)
CO2: 24 mmol/L (ref 20–32)
Calcium: 9.2 mg/dL (ref 8.6–10.3)
Chloride: 107 mmol/L (ref 98–110)
Creat: 1.11 mg/dL (ref 0.70–1.33)
GFR, EST NON AFRICAN AMERICAN: 75 mL/min/{1.73_m2} (ref >=60)
GFR, Est African American: 87 mL/min/{1.73_m2} (ref >=60)
Globulin: 3.2 g/dL (calc) (ref 1.9–3.7)
Glucose, Bld: 103 mg/dL — ABNORMAL HIGH (ref 65–99)
POTASSIUM: 4.2 mmol/L (ref 3.5–5.3)
Sodium: 139 mmol/L (ref 135–146)
Total Bilirubin: 0.5 mg/dL (ref 0.2–1.2)
Total Protein: 7.2 g/dL (ref 6.1–8.1)

## 2018-05-03 LAB — HEPATITIS C RNA QUANTITATIVE
HCV Quantitative Log: <1.18 Log IU/mL
HCV RNA, PCR, QN: <15 IU/mL

## 2018-05-15 ENCOUNTER — Encounter: Payer: Self-pay | Admitting: Infectious Diseases

## 2018-05-15 ENCOUNTER — Ambulatory Visit (INDEPENDENT_AMBULATORY_CARE_PROVIDER_SITE_OTHER): Payer: Self-pay | Admitting: Infectious Diseases

## 2018-05-15 VITALS — BP 118/80 | HR 78 | Temp 97.5°F | Wt 164.0 lb

## 2018-05-15 DIAGNOSIS — Z8619 Personal history of other infectious and parasitic diseases: Secondary | ICD-10-CM

## 2018-05-15 DIAGNOSIS — L601 Onycholysis: Secondary | ICD-10-CM

## 2018-05-15 DIAGNOSIS — Z23 Encounter for immunization: Secondary | ICD-10-CM

## 2018-05-15 DIAGNOSIS — M20002 Unspecified deformity of left finger(s): Secondary | ICD-10-CM

## 2018-05-15 DIAGNOSIS — B20 Human immunodeficiency virus [HIV] disease: Secondary | ICD-10-CM

## 2018-05-15 NOTE — Progress Notes (Signed)
Name: Jason [name]  DOB: 1965/10/08 MRN: [id number] PCP: Patient, No Pcp Per    Patient Active Problem List   Diagnosis Date Noted  . Onycholysis 05/15/2018  . Right knee pain 10/11/2017  . Bloating symptom 10/11/2017  . Malar rash 01/18/2017  . Inguinal hernia 01/18/2017  . Food impaction of esophagus 09/10/2016  . Dysphagia 07/19/2016  . Facial rash 03/03/2016  . AIDS (HCC) 01/18/2016  . Depression 01/18/2016  . 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 Hepatitis C virus infection cured after antiviral drug therapy 05/01/2014  . Human immunodeficiency virus (HIV) disease (HCC) 12/09/2013     Subjective:  CC:  HIV and Hepatitis C follow up care. Concerned about left middle finger rash/nailbed coming off.   HPI: Jason [name] is a 53 y.o. male with HIV disease and hepatitis c (1a, F3, s/p Harvoni x 12 w 2019). For his HIV he was switched back to [facility] in November 2019 d/t concern for weight gain associated with Biktarvy use. Since switching medication he reports 3 lb weightloss and "leveling off." He has less GI upset associated with Symtuza also which he is pleased about. He reports 100% adherence and no concern for side effects or difficilty accessing medications. Interval since last OV he had what he believes was the flu about 4 weeks ago where he had fevers up to 104 F, cough, sore throat and extreme fatigue/malaise. He has about 90% recovered but lingering cough w/o other symptoms.   He has had trouble with left middle finger separation of nail bed. Has had a waxing and waning crusting rash on the tip of the finger. 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 Every 3 weeks the crust falls off and then comes back. When this falls off it looks like light pink skin. Previously many years ago in [place] he had a long splinter in this finger that had to be removed, which was very painful. These recent nail/finger tip  changes have been getting worse it seems since he had an injury at work with the left ring finger in November. He sustained a laceration that "was cut down to the bone" but still is very swollen. He has some impaired range of motion of this finger and swelling at the knuckle that prohibits wedding ring to be re-applied. He has no pain but firmness on the palmar aspect under knuckle. Previous laceration repair healed up nicely. 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   He was treated in 2019 for chronic hepatitis c infection with 12 weeks of Harvoni. He had labs 2 weeks ago indicating SVR and cure of his infection. He was not able to do ultrasound to stage with elastography d/t not being NPO and did not have a pre-treatment baseline. FibroTest indicating F3 (severe fibrosis).  He has received flu vaccine for this season. Was given hepatitis b series but now only borderline immunity per 11/28/17 draw. He had one dose of Hep a vaccine in 2017 but no follow up titer.    Review of Systems  Constitutional: Negative for chills, fever, malaise/fatigue and weight loss.  HENT: Negative for sore throat.        No dental problems  Respiratory: Negative for cough and sputum production.   Cardiovascular: Negative for chest pain and leg swelling.  Gastrointestinal: Negative for abdominal pain, diarrhea and vomiting.  Genitourinary: Negative for dysuria and flank pain.  Musculoskeletal: Negative for joint pain, myalgias and neck pain.       Finger swelling as described above   Skin: Positive for rash (as detailed above ).  Neurological: Negative for dizziness, tingling and headaches.  Psychiatric/Behavioral: Negative for depression and substance abuse. The patient is nervous/anxious. The patient does not have insomnia.     Past Medical History:  Diagnosis Date  . AIDS (HCC) 01/18/2016  . Bloating symptom 10/11/2017  .  Depression 01/18/2016  . Dysphagia 07/19/2016  . Facial rash 03/03/2016  . Hepatitis C   . HIV positive (HCC)   . Inguinal hernia 01/18/2017  . Malar rash 01/18/2017  . Right knee pain 10/11/2017  . Thrush 03/03/2016    Outpatient Medications Prior to Visit  Medication Sig Dispense Refill  . Darunavir-Cobicisctat-Emtricitabine-Tenofovir Alafenamide (SYMTUZA) 800-150-200-10 MG TABS Take 1 tablet by mouth daily with breakfast. 30 tablet 11  . ondansetron (ZOFRAN ODT) 4 MG disintegrating tablet Take 1 tablet (4 mg total) by mouth every 8 (eight) hours as needed for nausea or vomiting. 20 tablet 0   No facility-administered medications prior to visit.      Allergies  Allergen Reactions  . Codeine Itching    Social History   Tobacco Use  . Smoking status: Never Smoker  . Smokeless tobacco: Never Used  Substance Use Topics  . Alcohol use: No    Comment: rare  . Drug use: Yes    Types: Marijuana    No family history on file.  Social History   Substance and Sexual Activity  Sexual Activity Not on file     Objective:   Vitals:   05/15/18 1349  BP: 118/80  Pulse: 78  Temp: (!) 97.5 F (36.4 C)  Weight: 164 lb (74.4 kg)   Body mass index is 24.94 kg/m.  Physical Exam Vitals signs reviewed.  Constitutional:      Appearance: Normal appearance. He is not ill-appearing.  HENT:     Mouth/Throat:     Mouth: Mucous membranes are moist.     Pharynx: Oropharynx is clear. No oropharyngeal exudate.  Eyes:     General: No scleral icterus. Cardiovascular:     Rate and Rhythm: Normal rate.     Pulses: Normal pulses.     Heart sounds: No murmur.  Pulmonary:     Effort: Pulmonary effort is normal.     Breath sounds: Normal breath sounds.     Comments: Dry cough demonstrated intermittently. Normal pulmonary assessment otherwise.  Musculoskeletal:     Comments: Left ring finger with significant swollen defect at the middle knuckle. Previously repaired laceration fully  healed. No warmth. Mild limitation of ROM with flexion of digit. No discoloration. Normal sensation in the distal finger tip however previously described to be tingling.   Skin:    Findings: Rash present.     Comments: Left middle finger with crusting, wart-like appearance of distal tip with onycholysis midway down nailbed. No nailbed drainage/swelling or tenderness.  No other nails have this appearance. The nail itself is not thickened or hyperkeratotic; smooth and w/o discoloration   Neurological:     Mental Status: He is alert.       Lab Results Lab Results  Component Value Date   WBC 5.2 01/31/2018   HGB 14.7 01/31/2018   HCT 42.7 01/31/2018   MCV 91.8 01/31/2018   PLT 266 01/31/2018    Lab Results  Component Value Date   CREATININE 1.11  05/01/2018   BUN 11 05/01/2018   NA 139 05/01/2018   K 4.2 05/01/2018   CL 107 05/01/2018   CO2 24 05/01/2018    Lab Results  Component Value Date   ALT 16 05/01/2018   AST 20 05/01/2018   ALKPHOS 58 11/03/2016   BILITOT 0.5 05/01/2018    Lab Results  Component Value Date   CHOL 178 01/31/2018   HDL 42 01/31/2018   LDLCALC 115 (H) 01/31/2018   TRIG 103 01/31/2018   CHOLHDL 4.2 01/31/2018   HIV 1 RNA Quant (copies/mL)  Date Value  05/01/2018 <20 NOT DETECTED  01/31/2018 20 (H)  11/28/2017 <20 NOT DETECTED   CD4 T Cell Abs (/uL)  Date Value  05/01/2018 150 (L)  01/31/2018 130 (L)  10/11/2017 150 (L)     Assessment & Plan:   Problem List Items Addressed This Visit      Unprioritized   Hepatitis C virus infection cured after antiviral drug therapy    He has signs of severe fibrosis on non-invasive liver staging. Would like to arrange a liver ultrasound for him to evaluate liver texture. Although the AASLD does not recommend ongoing screening [facility] in severe fibrosis w/o evidence of cirrhosis, considering co-infection with HIV I would like to check this for him. He is in agreement. His LFTs have normalized on therapy.        Relevant Orders   US ABDOMEN LIMITED RUQ   Human immunodeficiency virus (HIV) disease (HCC) - Primary    Well controlled on Symtuza. [facility] and feels that his weight gain has stabilized. He used to work out but since his dog bite injury 2 years ago of the LLE he has not gone back to gym yet but will start.  He has had chronically low CD4s < 200 but virologically suppressed. Not currently on OI proph and no signs of opportunistic infection today on exam.   Return in about 3 months (around 08/13/2018).       Relevant Orders   Pneumococcal polysaccharide vaccine 23-valent greater than or equal to 2yo subcutaneous/IM (Completed)   Onycholysis    Single finger with associated crusted rash involving the distal tip of the left middle finger that apparently comes back every 3 weeks. Proximal nail bed is normal in appearance. Nails are not overtly thickened. No bleeding or discharge. No pain.  Differential could include dermatophyte infection vs verruca vs psoriasis vs subungual squamous cell carcinoma. He is asking for a medication to help however considering varied differential I think it is best to have him seen by dermatology for evaluation and consideration of biopsy. His nail may need to come off for complete resolution or to facilitate proper diagnosis.  I asked him to keep hands clean and use gloves at work with water exposure, no biting nails and keep them short and trimmed.       Relevant Orders   Ambulatory referral to Dermatology    Other Visit Diagnoses    Need for pneumococcal vaccine       Relevant Orders   Pneumococcal polysaccharide vaccine 23-valent greater than or equal to 2yo subcutaneous/IM (Completed)      Rexene AlbertsStephanie , MSN, NP-C [facility] Pager: [phone] Office: [phone]  05/15/18  7:20 PM

## 2018-05-15 NOTE — Assessment & Plan Note (Signed)
Well controlled on Symtuza. He likes this regimen better than the [facility] and feels that his weight gain has stabilized. He used to work out but since his dog bite injury 2 years ago of the LLE he has not gone back to gym yet but will start.  He has had chronically low CD4s < 200 but virologically suppressed. Not currently on OI proph and no signs of opportunistic infection today on exam.   Return in about 3 months (around 08/13/2018).

## 2018-05-15 NOTE — Assessment & Plan Note (Signed)
Single finger with associated crusted rash involving the distal tip of the left middle finger that apparently comes back every 3 weeks. Proximal nail bed is normal in appearance. Nails are not overtly thickened. No bleeding or discharge. No pain.  Differential could include dermatophyte infection vs verruca vs psoriasis vs subungual squamous cell carcinoma. He is asking for a medication to help however considering varied differential I think it is best to have him seen by dermatology for evaluation and consideration of biopsy. His nail may need to come off for complete resolution or to facilitate proper diagnosis.  I asked him to keep hands clean and use gloves at work with water exposure, no biting nails and keep them short and trimmed.

## 2018-05-15 NOTE — Assessment & Plan Note (Addendum)
He has significant left ring finger swelling w/o signs of infection in the setting of trauma in November. ER visit reviewed - no fracture on xray and no foreign body. I offered hand x-ray today and referral to hand surgeon vs orthopedic surgeon - he declined.

## 2018-05-15 NOTE — Assessment & Plan Note (Addendum)
He has signs of severe fibrosis on non-invasive liver staging. Would like to arrange a liver ultrasound for him to evaluate liver texture. Although the AASLD does not recommend ongoing screening for [facility] in severe fibrosis w/o evidence of cirrhosis, considering co-infection with HIV I would like to check this for him. He is in agreement. His LFTs have normalized on therapy.

## 2018-05-15 NOTE — Patient Instructions (Addendum)
Wonderful to meet you.   I want you to keep taking your Symtuza once a day with food as you are. Your labs are undetectable which is excellent news!   I would like to do a ultrasound to look at your liver - will work on getting this set up. Nothing to eat or drink midnight before your test please. This will help [place] understand with better certainty what we need to do for future screenings of your liver.   Your hepatitis c infection has been cured!   Please sign up with MyChart to access your labs and set up email communication with our clinic for non-urgent medical concerns.  Activation Code: [id number]   Will look into some options for the rash/scab on your finger and send you a message through MyChart portal.   Please come back in 3 months to check in with [name].

## 2018-05-16 NOTE — Telephone Encounter (Signed)
Jason [name], can you please coordinate the dermatology referral per [name]? Thank you! Andree Coss, RN  Let me know when.      ----- Message -----  From: Rexene Alberts, NP  Sent: 05/16/18 9:13 AM  To: Jason [name]  Subject: Dermatology referral    [facility],     After researching what could be going on with your finger and nailbed, I think what we should do is have you see Dermatology to determine the best course of treatment for you. There are too many possibilities that I can think of that, for efficiency and best chance at a quick diagnosis/treatment this is the best step.     We will be forwarding your information to [facility] - they do accept patients without insurance (remember our discussion that the [employer] program does not cover this visit).     Happy to answer any questions/concerns you have.     Evelena Leyden, NP

## 2018-05-22 ENCOUNTER — Ambulatory Visit (HOSPITAL_COMMUNITY): Payer: Self-pay

## 2018-05-31 ENCOUNTER — Encounter: Payer: Self-pay | Admitting: Infectious Disease

## 2018-06-06 IMAGING — DX DG FOOT COMPLETE 3+V*L*
3 series · 3 of 3 positions shown · non-contrast
Comparison: None.

CLINICAL DATA: Stepped on glass.  Assess for foreign body.

EXAM:
LEFT FOOT - COMPLETE 3+ VIEW

[foot ap]
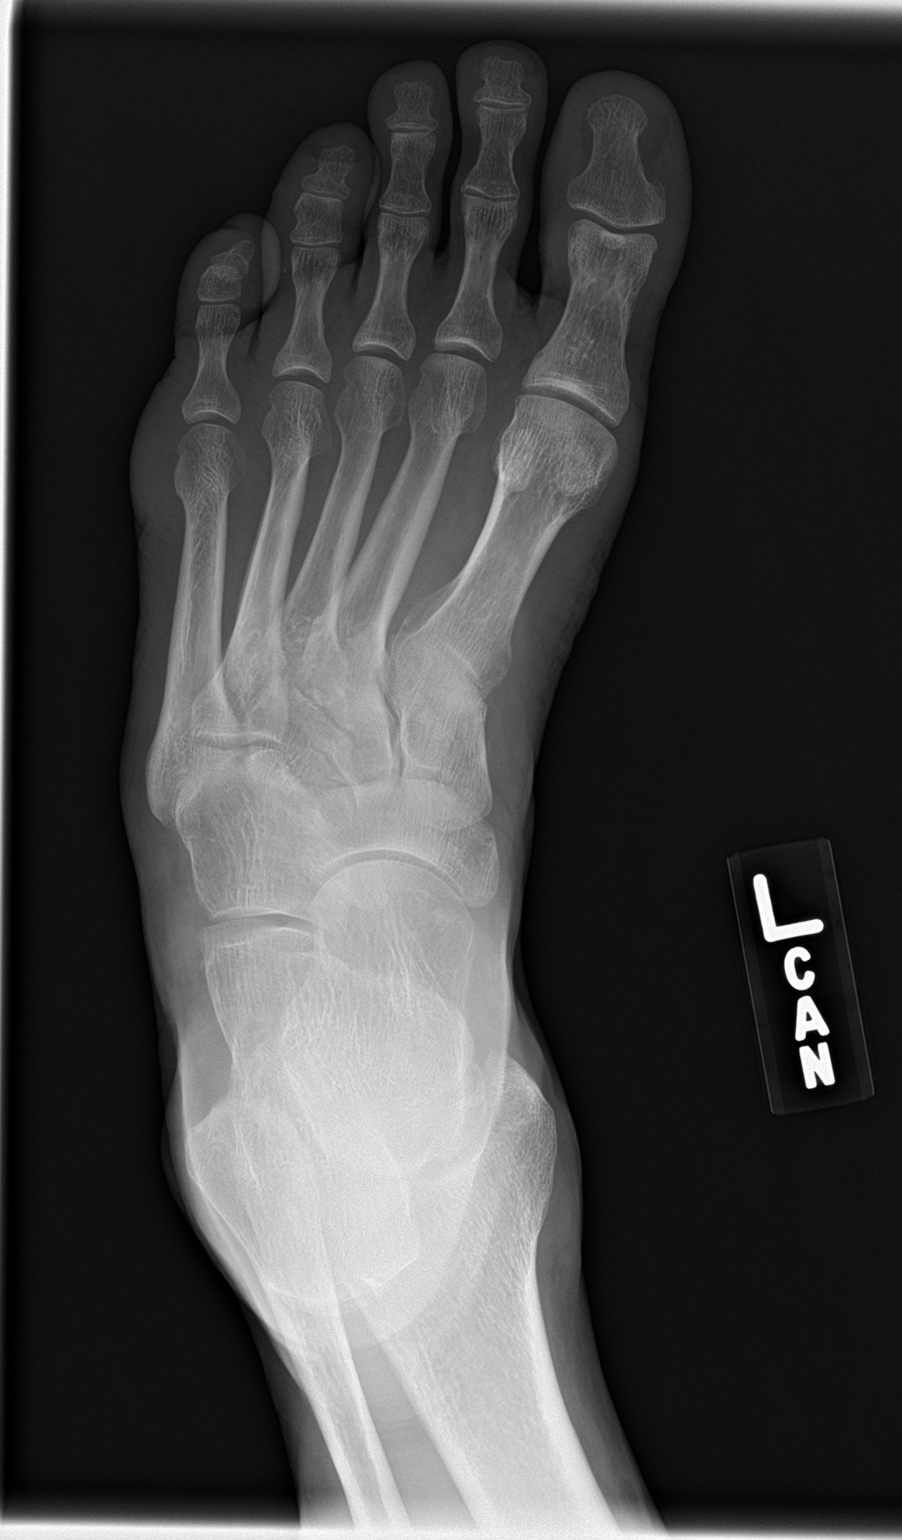

[foot obl]
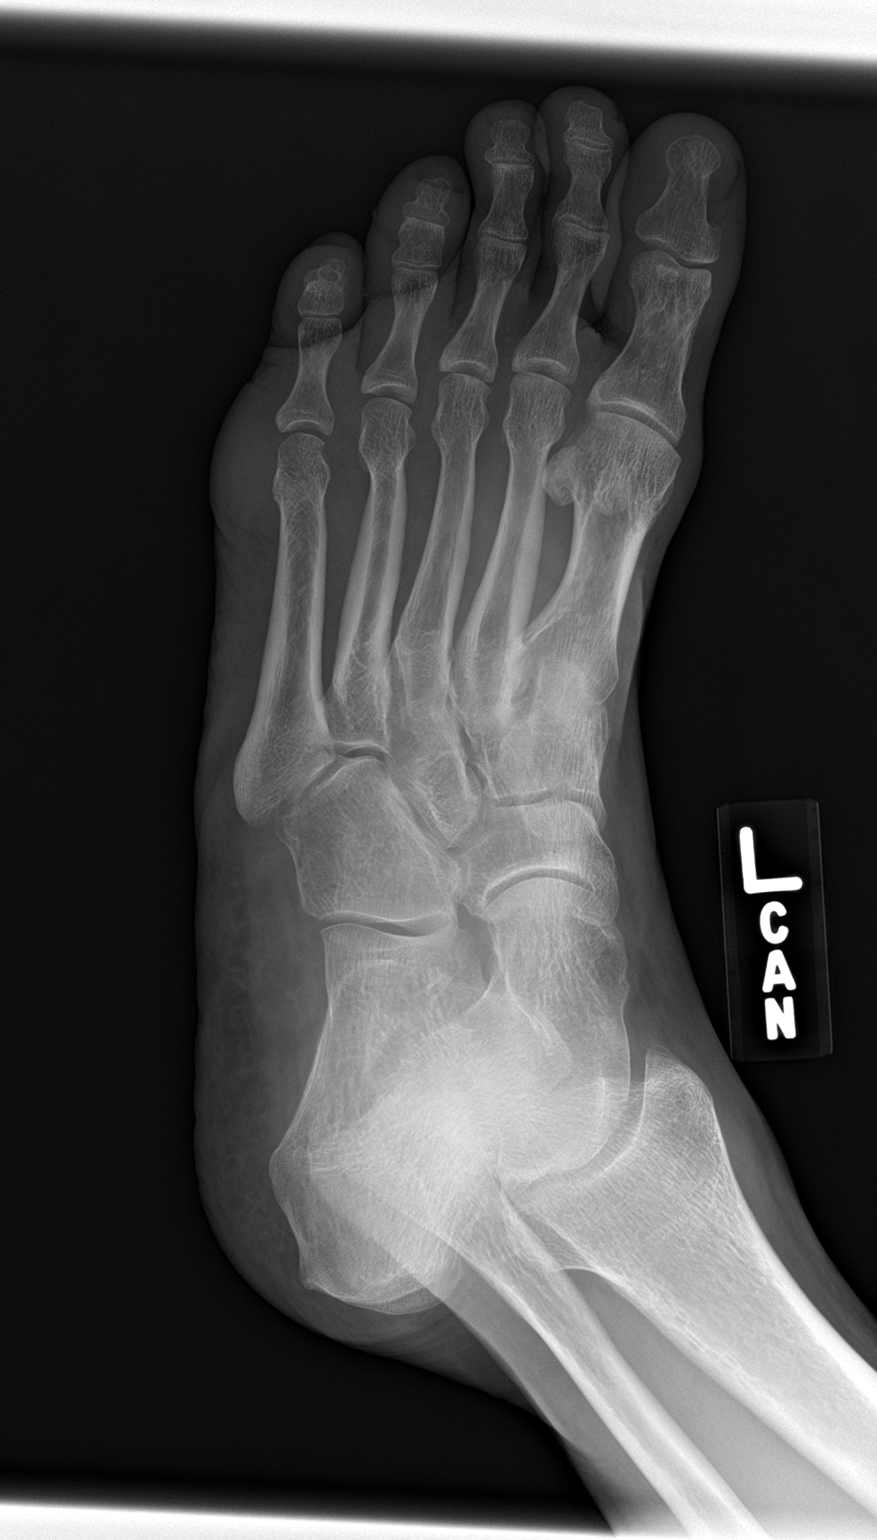

[foot lat]
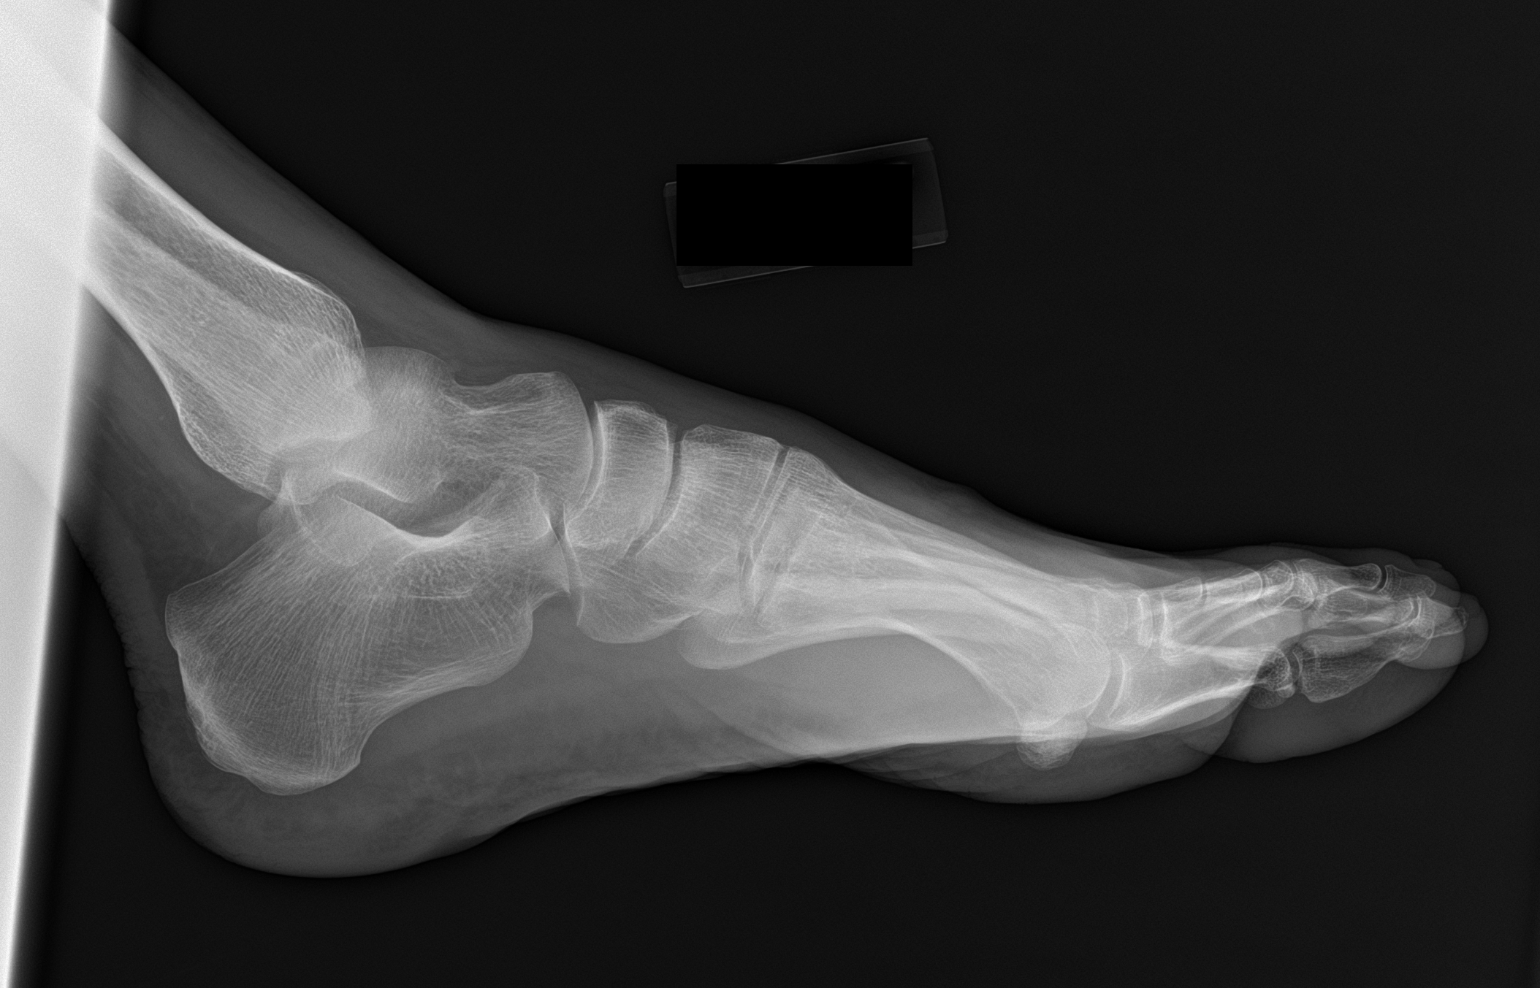

[3 of 3 positions shown; findings below may reference images not displayed]

FINDINGS: There is no evidence of fracture or dislocation. There is no
evidence of arthropathy or other focal bone abnormality. Soft
tissues are unremarkable.
IMPRESSION: Negative.

## 2018-06-06 IMAGING — DX DG FOOT COMPLETE 3+V*R*
3 series · 3 of 3 positions shown · non-contrast
Comparison: None.

CLINICAL DATA: Patient stepped on glass in the yard.

EXAM:
RIGHT FOOT COMPLETE - 3+ VIEW

[foot ap]
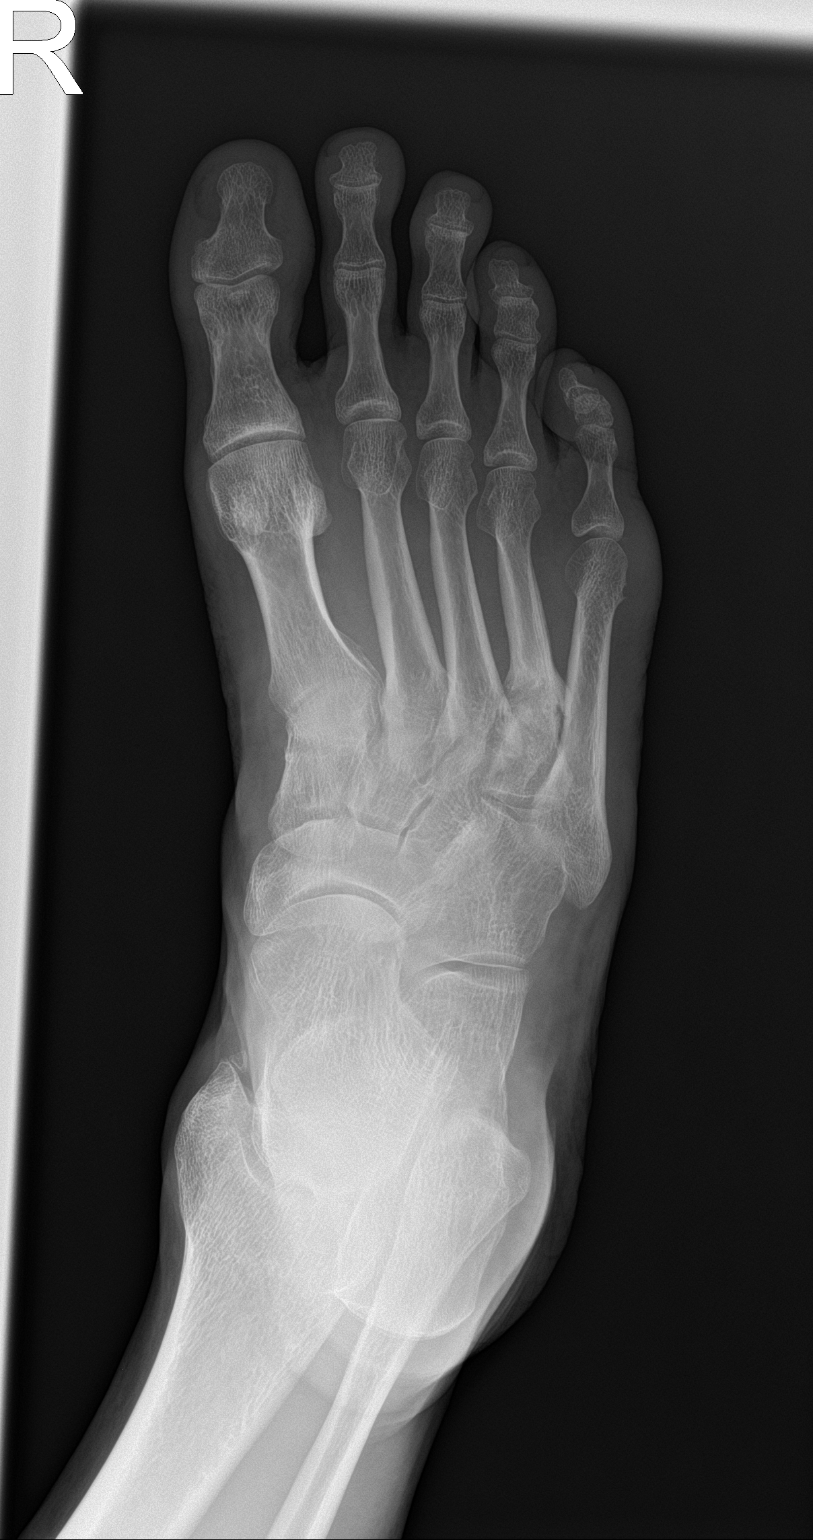

[foot obl]
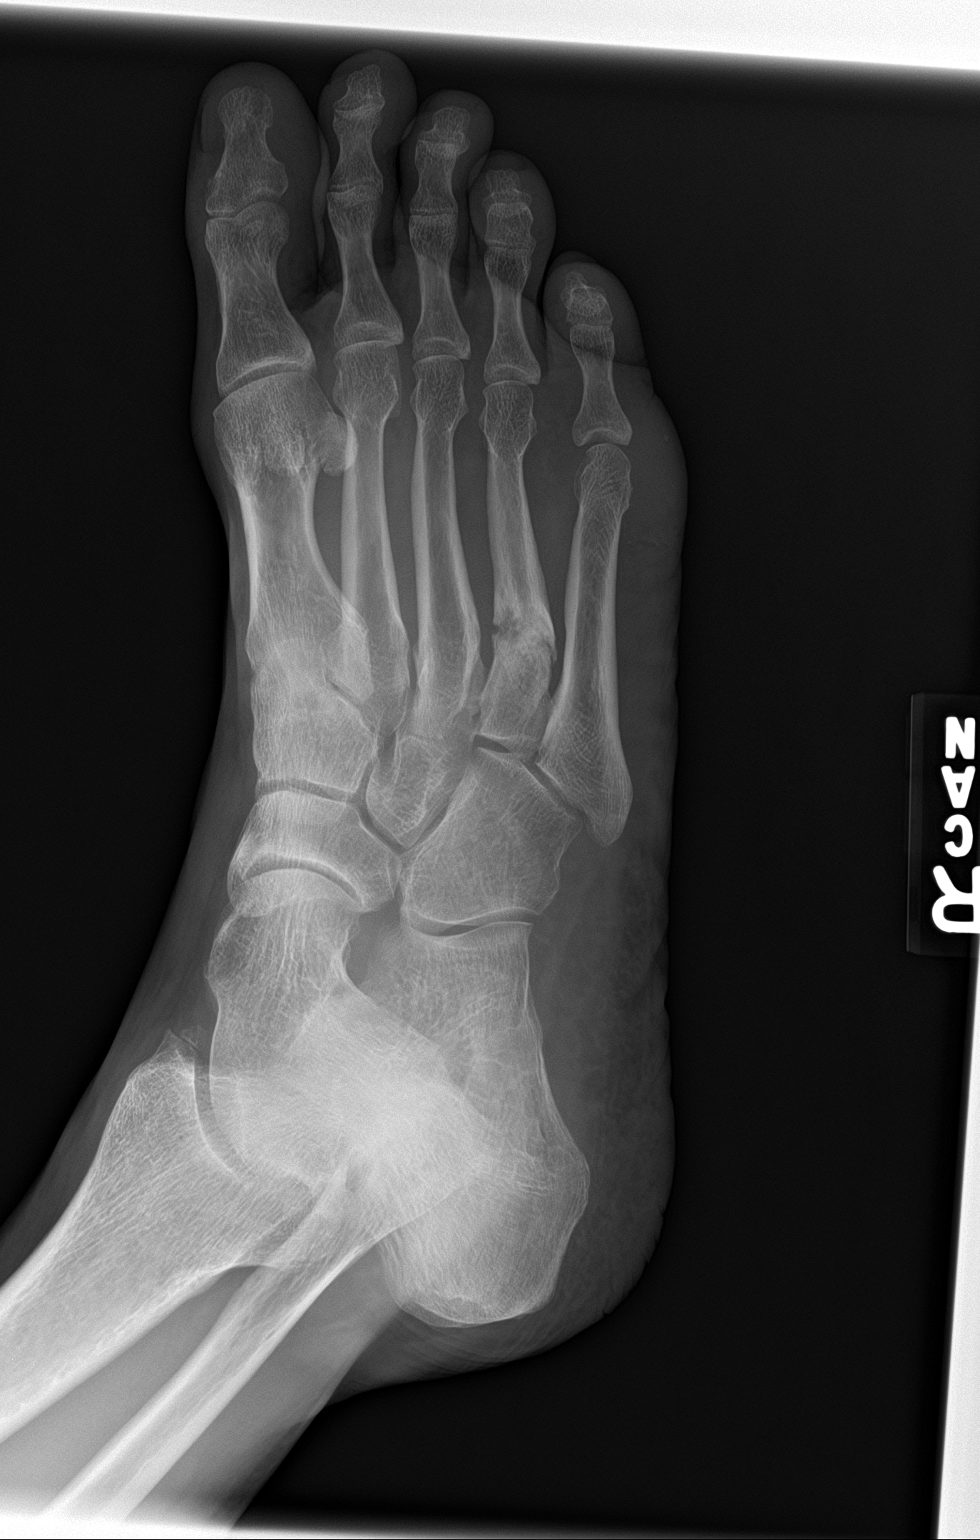

[foot lat]
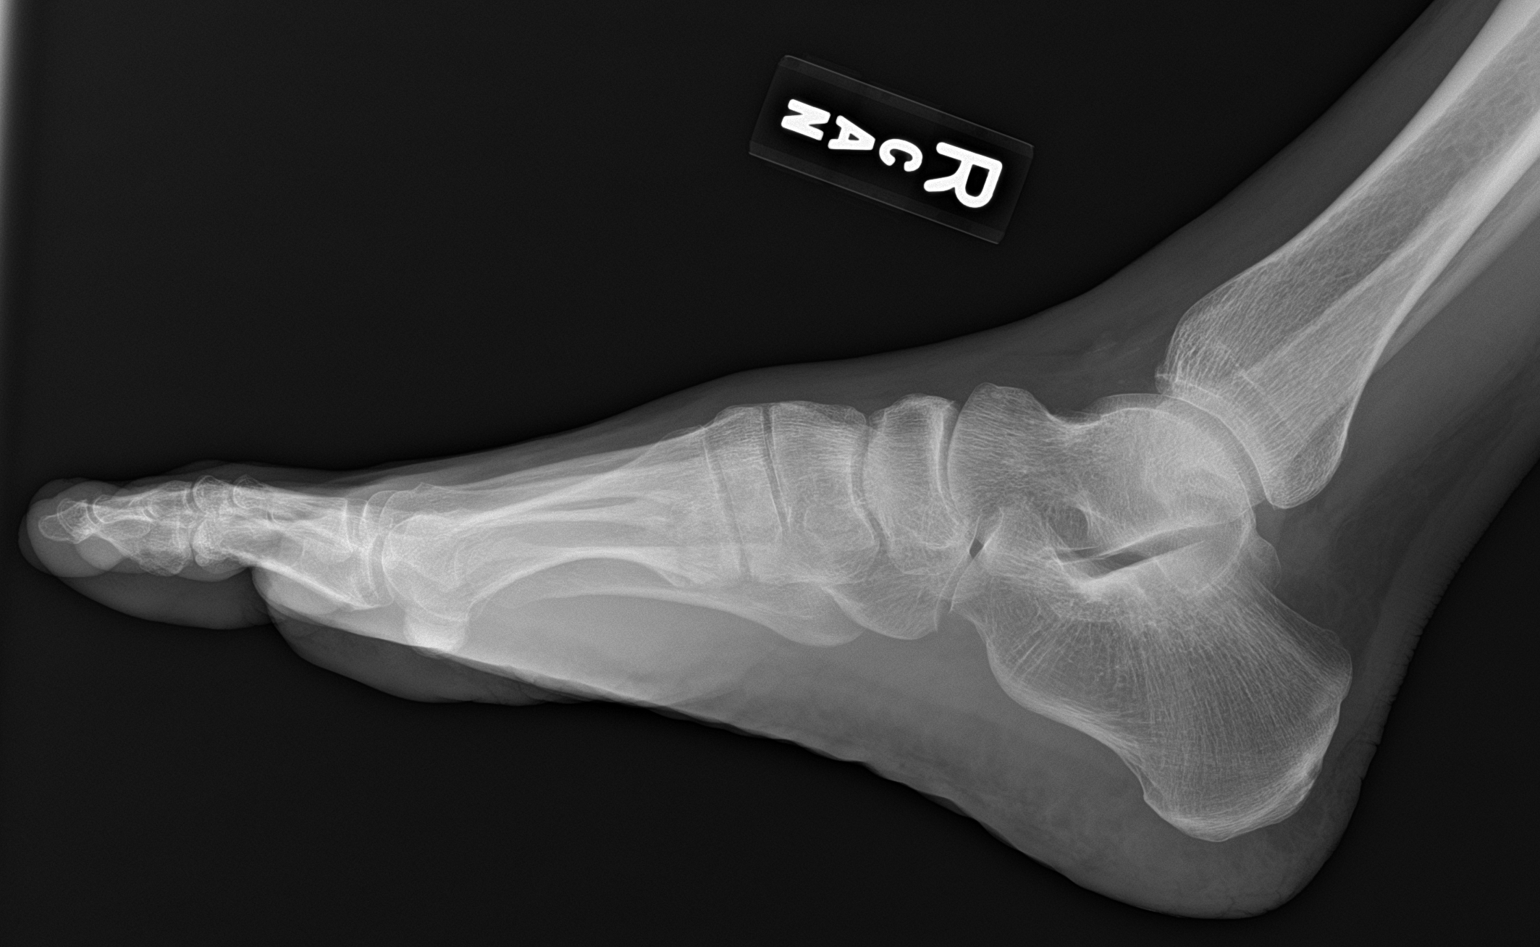

[3 of 3 positions shown; findings below may reference images not displayed]

FINDINGS: There is a healing transverse fracture of the proximal right fourth
metatarsal bone. Callus formation and sclerosis is present. No
significant displacement. This could represent a stress fracture. No
other fractures identified. Soft tissues are unremarkable. No
radiopaque soft tissue foreign bodies.
IMPRESSION: Healing transverse nondisplaced fracture of the proximal right
fourth metatarsal bone. No radiopaque soft tissue foreign bodies.

## 2018-06-12 ENCOUNTER — Other Ambulatory Visit: Payer: Self-pay

## 2018-06-12 ENCOUNTER — Ambulatory Visit (HOSPITAL_COMMUNITY)
Admission: RE | Admit: 2018-06-12 | Discharge: 2018-06-12 | Disposition: A | Discharge status: Home / Self Care | Payer: Self-pay | Source: Ambulatory Visit | Attending: Infectious Diseases | Admitting: Infectious Diseases

## 2018-06-12 DIAGNOSIS — Z8619 Personal history of other infectious and parasitic diseases: Secondary | ICD-10-CM | POA: Insufficient documentation

## 2018-06-13 NOTE — Progress Notes (Signed)
Fatty liver disease not a/w alcohol. Recommended dietary changes to reduce amount of carbohydrates taken in and reduce to eliminate flour/sugar

## 2018-07-10 ENCOUNTER — Encounter: Payer: Self-pay | Admitting: Family

## 2018-07-10 ENCOUNTER — Ambulatory Visit (INDEPENDENT_AMBULATORY_CARE_PROVIDER_SITE_OTHER): Payer: Self-pay | Admitting: Family

## 2018-07-10 ENCOUNTER — Other Ambulatory Visit: Payer: Self-pay

## 2018-07-10 DIAGNOSIS — K0889 Other specified disorders of teeth and supporting structures: Secondary | ICD-10-CM

## 2018-07-10 MED ORDER — AMOXICILLIN-POT CLAVULANATE 875-125 MG PO TABS: 1.0000 | ORAL_TABLET | Freq: Two times a day (BID) | ORAL | 0 refills | Status: DC

## 2018-07-10 NOTE — Progress Notes (Signed)
Subjective:    Patient ID: Jason [name], male    DOB: Jul 09, 1965, 53 y.o.   MRN: [id number]  Chief Complaint  Patient presents with  . Dental Pain     Virtual Visit via Telephone Note   I connected with Mr. Jason [name] on 07/11/2018 at 2:40 pm    by telephone and verified that I am speaking with the correct person using two identifiers.   I discussed the limitations, risks, security and privacy concerns of performing an evaluation and management service by telephone and the availability of in person appointments. I also discussed with the patient that there may be a patient responsible charge related to this service. The patient expressed understanding and agreed to proceed.   HPI:  Jason [name] is a 53 y.o. male who is having dental pain located on the left upper side of his mouth for about 3-4 days now. Pain is described as dull and achy with sensitivity to cold. He does on occasion have a foul taste in his mouth. Previously evaluated by [facility] dental clinic with additional work being needed. He has had no fevers, chills or sweats.    Allergies  Allergen Reactions  . Codeine Itching      Outpatient Medications Prior to Visit  Medication Sig Dispense Refill  . Darunavir-Cobicisctat-Emtricitabine-Tenofovir Alafenamide (SYMTUZA) 800-150-200-10 MG TABS Take 1 tablet by mouth daily with breakfast. 30 tablet 11  . ondansetron (ZOFRAN ODT) 4 MG disintegrating tablet Take 1 tablet (4 mg total) by mouth every 8 (eight) hours as needed for nausea or vomiting. 20 tablet 0   No facility-administered medications prior to visit.      Past Medical History:  Diagnosis Date  . AIDS (HCC) 01/18/2016  . Bloating symptom 10/11/2017  . Depression 01/18/2016  . Dysphagia 07/19/2016  . Facial rash 03/03/2016  . Hepatitis C   . HIV positive (HCC)   . Inguinal hernia 01/18/2017  . Malar rash 01/18/2017  . Right knee pain 10/11/2017  . Thrush 03/03/2016     Past Surgical History:   Procedure Laterality Date  . ESOPHAGOGASTRODUODENOSCOPY (EGD) WITH PROPOFOL N/A 09/10/2016   Procedure: ESOPHAGOGASTRODUODENOSCOPY (EGD) WITH PROPOFOL;  Surgeon: Vida Rigger, MD;  [facility] END[facility];  Service: Endoscopy;  Laterality: N/A;       Review of Systems  Constitutional: Negative for chills, fatigue, fever and unexpected weight change.  HENT: Positive for dental problem. Negative for trouble swallowing and voice change.   Respiratory: Negative for chest tightness, shortness of breath and wheezing.   Cardiovascular: Negative for chest pain.      Objective:    There were no vitals taken for this visit. Nursing note and vital signs reviewed.  Physical Exam  Mr. Engert is pleasant to speak sounds to be doing with a potential dental infection given the description of swelling and pain on the left side of his mouth.     Assessment & Plan:   Problem List Items Addressed This Visit      Other   Pain, dental - Primary    Mr. Seja sounds to have symptoms that are consistent with a dental infection. Plan for treatment with Augmentin. He declines lidocaine indicating he has some remaining from previous. Advised to seek further care if worsening or if fever develops. Recommend follow up with dentistry for source control and definitive care.          I am having Jason [name] start on amoxicillin-clavulanate. I am also having him maintain his ondansetron and  Darunavir-Cobicisctat-Emtricitabine-Tenofovir Alafenamide.   Meds ordered this encounter  Medications  . amoxicillin-clavulanate (AUGMENTIN) 875-125 MG tablet    Sig: Take 1 tablet by mouth 2 (two) times daily.    Dispense:  20 tablet    Refill:  0    Order Specific Question:   Supervising Provider    Answer:   Judyann MunsonSNIDER, CYNTHIA [phone]]     I discussed the assessment and treatment plan with the patient. The patient was provided an opportunity to ask questions and all were answered. The patient agreed with the plan and  demonstrated an understanding of the instructions.   The patient was advised to call back or seek an in-person evaluation if the symptoms worsen or if the condition fails to improve as anticipated.   I provided 12  minutes of non-face-to-face time during this encounter.  Follow-up: Return if symptoms worsen or fail to improve.   Jason EkeGreg Corynn Solberg, MSN, FNP-C Nurse Practitioner [facility] Main number: [phone]

## 2018-07-11 ENCOUNTER — Encounter: Payer: Self-pay | Admitting: Family

## 2018-07-11 DIAGNOSIS — K0889 Other specified disorders of teeth and supporting structures: Secondary | ICD-10-CM | POA: Insufficient documentation

## 2018-07-11 NOTE — Patient Instructions (Signed)
Nice to speak with you.  I have sent in a prescription for Augmentin for your dental infection.  If your symptoms worsen or you develop fever please seek additional care.   Recommend follow up with dentist as able for definitive care.

## 2018-07-11 NOTE — Assessment & Plan Note (Signed)
Mr. Bechler sounds to have symptoms that are consistent with a dental infection. Plan for treatment with Augmentin. He declines lidocaine indicating he has some remaining from previous. Advised to seek further care if worsening or if fever develops. Recommend follow up with dentistry for source control and definitive care.

## 2018-08-15 ENCOUNTER — Ambulatory Visit (INDEPENDENT_AMBULATORY_CARE_PROVIDER_SITE_OTHER): Payer: Self-pay | Admitting: Infectious Disease

## 2018-08-15 ENCOUNTER — Other Ambulatory Visit: Payer: Self-pay

## 2018-08-15 ENCOUNTER — Encounter: Payer: Self-pay | Admitting: Infectious Disease

## 2018-08-15 DIAGNOSIS — K0889 Other specified disorders of teeth and supporting structures: Secondary | ICD-10-CM

## 2018-08-15 DIAGNOSIS — M25522 Pain in left elbow: Secondary | ICD-10-CM

## 2018-08-15 DIAGNOSIS — B20 Human immunodeficiency virus [HIV] disease: Secondary | ICD-10-CM

## 2018-08-15 HISTORY — DX: Pain in left elbow: M25.522

## 2018-08-15 MED ORDER — AMOXICILLIN-POT CLAVULANATE 875-125 MG PO TABS: 1.0000 | ORAL_TABLET | Freq: Two times a day (BID) | ORAL | 2 refills | Status: DC

## 2018-08-15 NOTE — Progress Notes (Signed)
Virtual Visit via Telephone Note  I connected with Jason [name] on 08/15/18 at 10:00 AM EDT by telephone and verified that I am speaking with the correct person using two identifiers.  Location: Patient:  Home Provider:[facility]   I discussed the limitations, risks, security and privacy concerns of performing an evaluation and management service by telephone and the availability of in person appointments. I also discussed with the patient that there may be a patient responsible charge related to this service. The patient expressed understanding and agreed to proceed.   History of Present Illness:  53 year old Caucasian male with history of HIV who has been well controlled on Symtuza.  His viral load remains undetectable and CD4 count is healthy.  He has been struggling with some dental infections and recently was prescribed Augmentin by Marcos Eke during a telemetry visit.  He is now experienced recurrence of infection at this broken tooth that was never removed by her ambulatory dental clinic.  He was in communication with [employer] who would recommended a specific dental clinic here in [place] that he could go to but he believes he cannot afford it at present.  I was quite happy to fill the Augmentin but I reminded him that this would not solve the problem and ultimately would need the tooth extracted.  He also had some other complaints including some pain in his right elbow where he feels like a tendon is irritated.  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  He has been sheltering in place.     Observations/Objective:  HIV disease: Well controlled  Pain in left elbow: Sounds musculoskeletal  Infected tooth  Assessment and Plan:  HIV disease: Continue Symtuza he will come back in  the fall for visit so we can make sure he gets flu vaccine he will renew his HIV medication assistance program  Left elbow pain: Sounds a bit like tendinitis certainly is a musculoskeletal issue of asked him to try to take pressure off this area and also try not to sleep on at night put a pillow under his left armpit  Infected tooth we will give him Augmentin with refills he really does need to be seen by dentist though  Follow Up Instructions:    I discussed the assessment and treatment plan with the patient. The patient was provided an opportunity to ask questions and all were answered. The patient agreed with the plan and demonstrated an understanding of the instructions.   The patient was advised to call back or seek an in-person evaluation if the symptoms worsen or if the condition fails to improve as anticipated.  I provided 22 minutes of non-face-to-face time during this encounter.   Acey Lav, MD

## 2018-11-21 ENCOUNTER — Ambulatory Visit: Payer: Self-pay | Admitting: Infectious Disease

## 2018-11-30 ENCOUNTER — Other Ambulatory Visit: Payer: Self-pay | Admitting: *Deleted

## 2018-11-30 DIAGNOSIS — B20 Human immunodeficiency virus [HIV] disease: Secondary | ICD-10-CM

## 2018-12-05 ENCOUNTER — Encounter: Payer: Self-pay | Admitting: Infectious Disease

## 2018-12-05 ENCOUNTER — Ambulatory Visit (INDEPENDENT_AMBULATORY_CARE_PROVIDER_SITE_OTHER): Payer: Self-pay | Admitting: Infectious Disease

## 2018-12-05 ENCOUNTER — Other Ambulatory Visit: Payer: Self-pay

## 2018-12-05 VITALS — BP 110/74 | HR 80 | Temp 98.0°F

## 2018-12-05 DIAGNOSIS — N5089 Other specified disorders of the male genital organs: Secondary | ICD-10-CM

## 2018-12-05 DIAGNOSIS — B20 Human immunodeficiency virus [HIV] disease: Secondary | ICD-10-CM

## 2018-12-05 DIAGNOSIS — A563 Chlamydial infection of anus and rectum: Secondary | ICD-10-CM

## 2018-12-05 HISTORY — DX: Other specified disorders of the male genital organs: N50.89

## 2018-12-05 NOTE — Progress Notes (Signed)
Subjective:   Chief complaint:  Patient ID: Jason [name], male    DOB: [birth year], 53 y.o.   MRN: [id number]  HPI  53 year old Caucasian man with HIV who came in for routine labs but had wanted someone to look at his penis because he had developed a circumferential oozing ulcer around his scrotum and penis after wearing a ring around this area for an entire night after falling asleep in it.  It is painful and oozes material but no pus. He has been applying topical antibiotics.   Past Medical History:  Diagnosis Date  . AIDS (HCC) 01/18/2016  . Bloating symptom 10/11/2017  . Depression 01/18/2016  . Dysphagia 07/19/2016  . Facial rash 03/03/2016  . Hepatitis C   . HIV positive (HCC)   . Inguinal hernia 01/18/2017  . Left elbow pain 08/15/2018  . Malar rash 01/18/2017  . Right knee pain 10/11/2017  . Thrush 03/03/2016    Past Surgical History:  Procedure Laterality Date  . ESOPHAGOGASTRODUODENOSCOPY (EGD) WITH PROPOFOL N/A 09/10/2016   Procedure: ESOPHAGOGASTRODUODENOSCOPY (EGD) WITH PROPOFOL;  Surgeon: Vida RiggerMagod, Marc, MD;  Location: [facility] ENDOSCOPY;  Service: Endoscopy;  Laterality: N/A;    No family history on file.    Social History   Socioeconomic History  . Marital status: Single    Spouse name: Not on file  . Number of children: Not on file  . Years of education: Not on file  . Highest education level: Not on file  Occupational History  . Not on file  Social Needs  . Financial resource strain: Not on file  . Food insecurity    Worry: Not on file    Inability: Not on file  . Transportation needs    Medical: Not on file    Non-medical: Not on file  Tobacco Use  . Smoking status: Never Smoker  . Smokeless tobacco: Never Used  Substance and Sexual Activity  . Alcohol use: No    Comment: rare  . Drug use: Yes    Types: Marijuana  . Sexual activity: Not on file  Lifestyle  . Physical activity    Days per week: Not on file    Minutes per session: Not on file  .  Stress: Not on file  Relationships  . Social Musicianconnections    Talks on phone: Not on file    Gets together: Not on file    Attends religious service: Not on file    Active member of club or organization: Not on file    Attends meetings of clubs or organizations: Not on file    Relationship status: Not on file  Other Topics Concern  . Not on file  Social History Narrative  . Not on file    Allergies  Allergen Reactions  . Codeine Itching     Current Outpatient Medications:  .  amoxicillin-clavulanate (AUGMENTIN) 875-125 MG tablet, Take 1 tablet by mouth 2 (two) times daily., Disp: 28 tablet, Rfl: 2 .  Darunavir-Cobicisctat-Emtricitabine-Tenofovir Alafenamide (SYMTUZA) 800-150-200-10 MG TABS, Take 1 tablet by mouth daily with breakfast., Disp: 30 tablet, Rfl: 11 .  ondansetron (ZOFRAN ODT) 4 MG disintegrating tablet, Take 1 tablet (4 mg total) by mouth every 8 (eight) hours as needed for nausea or vomiting., Disp: 20 tablet, Rfl: 0   Review of Systems  Constitutional: Negative for chills and fever.  HENT: Negative for congestion.   Eyes: Negative for photophobia.  Respiratory: Negative for cough, shortness of breath and wheezing.   Cardiovascular: Negative  for chest pain, palpitations and leg swelling.  Gastrointestinal: Negative for abdominal distention, abdominal pain, blood in stool, constipation, diarrhea, nausea and vomiting.  Genitourinary: Negative for dysuria, flank pain and hematuria.  Musculoskeletal: Negative for back pain and myalgias.  Neurological: Negative for dizziness, weakness and headaches.  Hematological: Does not bruise/bleed easily.  Psychiatric/Behavioral: Negative for decreased concentration, dysphoric mood, self-injury, sleep disturbance and suicidal ideas. The patient is not nervous/anxious.        Objective:   Physical Exam  Constitutional: He is oriented to person, place, and time. He appears well-developed and well-nourished. No distress.  HENT:   Head: Normocephalic and atraumatic.  Mouth/Throat: Uvula is midline and oropharynx is clear and moist. No oropharyngeal exudate.  Eyes: Conjunctivae and EOM are normal. No scleral icterus.  Neck: Normal range of motion. Neck supple.  Cardiovascular: Normal rate and regular rhythm.  Pulmonary/Chest: Effort normal. No respiratory distress. He has no wheezes.  Abdominal: Soft. He exhibits no distension. A hernia is present.  Genitourinary: Penile tenderness present.  Musculoskeletal: Normal range of motion.        General: No tenderness or edema.  Neurological: He is alert and oriented to person, place, and time. He exhibits normal muscle tone. Coordination normal.  Skin: Skin is warm and dry. No rash noted. He is not diaphoretic. There is erythema. No pallor.     Psychiatric: He has a normal mood and affect. His speech is normal and behavior is normal. Judgment and thought content normal. Cognition and memory are normal.  Nursing note and vitals reviewed.  Circumferential oozing ulcer 12/05/2018:     Assessment & Plan:    Traumatic ulcer to penis and scrotum: does not appear to be infected Apply vaseline, keep clean  HIV: checking labs today and offered flu shot

## 2018-12-06 LAB — T-HELPER CELL (CD4) - (RCID CLINIC ONLY)
CD4 % Helper T Cell: 16 % — ABNORMAL LOW (ref 33–65)
CD4 T Cell Abs: 231 /uL — ABNORMAL LOW (ref 400–1790)

## 2018-12-08 LAB — CBC WITH DIFFERENTIAL/PLATELET
Absolute Monocytes: 818 cells/uL (ref 200–950)
Basophils Absolute: 17 cells/uL (ref 0–200)
Basophils Relative: 0.3 %
Eosinophils Absolute: 215 cells/uL (ref 15–500)
Eosinophils Relative: 3.7 %
HCT: 41.7 % (ref 38.5–50.0)
Hemoglobin: 14 g/dL (ref 13.2–17.1)
Lymphs Abs: 1433 cells/uL (ref 850–3900)
MCH: 30.5 pg (ref 27.0–33.0)
MCHC: 33.6 g/dL (ref 32.0–36.0)
MCV: 90.8 fL (ref 80.0–100.0)
MPV: 9.7 fL (ref 7.5–12.5)
Monocytes Relative: 14.1 %
Neutro Abs: 3318 cells/uL (ref 1500–7800)
Neutrophils Relative %: 57.2 %
Platelets: 321 10*3/uL (ref 140–400)
RBC: 4.59 10*6/uL (ref 4.20–5.80)
RDW: 12.7 % (ref 11.0–15.0)
Total Lymphocyte: 24.7 %
WBC: 5.8 10*3/uL (ref 3.8–10.8)

## 2018-12-08 LAB — COMPLETE METABOLIC PANEL WITH GFR
AG Ratio: 1.3 (calc) (ref 1.0–2.5)
ALT: 11 U/L (ref 9–46)
AST: 15 U/L (ref 10–35)
Albumin: 4 g/dL (ref 3.6–5.1)
Alkaline phosphatase (APISO): 69 U/L (ref 35–144)
BUN: 13 mg/dL (ref 7–25)
CO2: 22 mmol/L (ref 20–32)
Calcium: 9.2 mg/dL (ref 8.6–10.3)
Chloride: 106 mmol/L (ref 98–110)
Creat: 0.96 mg/dL (ref 0.70–1.33)
GFR, Est African American: 103 mL/min/{1.73_m2} (ref >=60)
GFR, Est Non African American: 89 mL/min/{1.73_m2} (ref >=60)
Globulin: 3.1 g/dL (calc) (ref 1.9–3.7)
Glucose, Bld: 92 mg/dL (ref 65–99)
Potassium: 4.2 mmol/L (ref 3.5–5.3)
Sodium: 140 mmol/L (ref 135–146)
Total Bilirubin: 0.4 mg/dL (ref 0.2–1.2)
Total Protein: 7.1 g/dL (ref 6.1–8.1)

## 2018-12-08 LAB — HIV-1 RNA QUANT-NO REFLEX-BLD
HIV 1 RNA Quant: <20 copies/mL
HIV-1 RNA Quant, Log: <1.3 Log copies/mL

## 2018-12-17 ENCOUNTER — Encounter: Payer: Self-pay | Admitting: Infectious Disease

## 2018-12-17 ENCOUNTER — Other Ambulatory Visit: Payer: Self-pay

## 2018-12-17 ENCOUNTER — Ambulatory Visit (INDEPENDENT_AMBULATORY_CARE_PROVIDER_SITE_OTHER): Payer: Self-pay | Admitting: Infectious Disease

## 2018-12-17 DIAGNOSIS — N5089 Other specified disorders of the male genital organs: Secondary | ICD-10-CM

## 2018-12-17 DIAGNOSIS — B20 Human immunodeficiency virus [HIV] disease: Secondary | ICD-10-CM

## 2018-12-17 NOTE — Progress Notes (Signed)
Virtual Visit via Telephone Note  I connected with Jason [name] on 12/17/18 at  2:45 PM EDT by telephone and verified that I am speaking with the correct person using two identifiers.  Location: Patient: Home Provider: [facility]   I discussed the limitations, risks, security and privacy concerns of performing an evaluation and management service by telephone and the availability of in person appointments. I also discussed with the patient that there may be a patient responsible charge related to this service. The patient expressed understanding and agreed to proceed.  Chief complaint: Continued ulceration around the scrotum and penis but it is improving  History of Present Illness:  53 year old Caucasian man with HIV who came in for routine labs on December 05, 2018 but had wanted someone to look at his penis because he had developed a circumferential oozing ulcer around his scrotum and penis after wearing a ring around this area for an entire night after falling asleep in it.  It is painful and oozes material but no pus. He has been applying topical antibiotics.  Change some topical antibiotics to Vaseline topically and try to reduce friction.  He has had improvement in the ulcer since then but is not completely resolved.  His labs from an HIV standpoint are encouraging with a suppressed viral load is less than 20 and CD4 count that is above 200.  He has been highly adherent to his antiretrovirals since coming into care here with [place] in [place] after having a lapse in care through when he was at [facility] and when he came here and also when he was [place] he had a very low CD4 nadir of somewhere around 40 cells.  He is now highly motivated and taking his antiretroviral medications.  He is seeing Dr. Janalyn Harder as part of the [facility] study.     Observations/Objective:  His HIV is well controlled and by report his scrotal ulceration is improving.  He is  renewing his HIV medication assistance program through [place] [facility]  Assessment and Plan:  HIV disease: Continue Symtuza will send this to his pharmacy  Scrotal ulcer continue topical Vaseline for now.  Need for vaccination he will be scheduled and flu clinic.   Follow Up Instructions:    I discussed the assessment and treatment plan with the patient. The patient was provided an opportunity to ask questions and all were answered. The patient agreed with the plan and demonstrated an understanding of the instructions.   The patient was advised to call back or seek an in-person evaluation if the symptoms worsen or if the condition fails to improve as anticipated.  I provided 15 minutes of non-face-to-face time during this encounter.   Alcide Evener, MD

## 2019-03-20 ENCOUNTER — Other Ambulatory Visit: Payer: Self-pay | Admitting: Infectious Disease

## 2019-03-28 NOTE — Addendum Note (Signed)
Addended by: Dolan Amen D on: 03/28/2019 09:02 AM   Modules accepted: Orders

## 2019-04-02 ENCOUNTER — Other Ambulatory Visit: Payer: Self-pay | Admitting: *Deleted

## 2019-04-02 DIAGNOSIS — B20 Human immunodeficiency virus [HIV] disease: Secondary | ICD-10-CM

## 2019-04-04 ENCOUNTER — Other Ambulatory Visit: Payer: Self-pay

## 2019-04-04 DIAGNOSIS — B20 Human immunodeficiency virus [HIV] disease: Secondary | ICD-10-CM

## 2019-04-05 LAB — T-HELPER CELL (CD4) - (RCID CLINIC ONLY)
CD4 % Helper T Cell: 12 % — ABNORMAL LOW (ref 33–65)
CD4 T Cell Abs: 195 /uL — ABNORMAL LOW (ref 400–1790)

## 2019-04-12 LAB — CBC WITH DIFFERENTIAL/PLATELET
Absolute Monocytes: 1165 cells/uL — ABNORMAL HIGH (ref 200–950)
Basophils Absolute: 31 cells/uL (ref 0–200)
Basophils Relative: 0.5 %
Eosinophils Absolute: 159 cells/uL (ref 15–500)
Eosinophils Relative: 2.6 %
HCT: 43.3 % (ref 38.5–50.0)
Hemoglobin: 14.9 g/dL (ref 13.2–17.1)
Lymphs Abs: 1513 cells/uL (ref 850–3900)
MCH: 31.7 pg (ref 27.0–33.0)
MCHC: 34.4 g/dL (ref 32.0–36.0)
MCV: 92.1 fL (ref 80.0–100.0)
MPV: 9.9 fL (ref 7.5–12.5)
Monocytes Relative: 19.1 %
Neutro Abs: 3233 cells/uL (ref 1500–7800)
Neutrophils Relative %: 53 %
Platelets: 282 10*3/uL (ref 140–400)
RBC: 4.7 10*6/uL (ref 4.20–5.80)
RDW: 12.7 % (ref 11.0–15.0)
Total Lymphocyte: 24.8 %
WBC: 6.1 10*3/uL (ref 3.8–10.8)

## 2019-04-12 LAB — COMPLETE METABOLIC PANEL WITH GFR
AG Ratio: 1.3 (calc) (ref 1.0–2.5)
ALT: 13 U/L (ref 9–46)
AST: 20 U/L (ref 10–35)
Albumin: 4.2 g/dL (ref 3.6–5.1)
Alkaline phosphatase (APISO): 83 U/L (ref 35–144)
BUN: 14 mg/dL (ref 7–25)
CO2: 21 mmol/L (ref 20–32)
Calcium: 9.6 mg/dL (ref 8.6–10.3)
Chloride: 104 mmol/L (ref 98–110)
Creat: 0.82 mg/dL (ref 0.70–1.33)
GFR, Est African American: 116 mL/min/{1.73_m2} (ref >=60)
GFR, Est Non African American: 100 mL/min/{1.73_m2} (ref >=60)
Globulin: 3.2 g/dL (calc) (ref 1.9–3.7)
Glucose, Bld: 120 mg/dL — ABNORMAL HIGH (ref 65–99)
Potassium: 4.1 mmol/L (ref 3.5–5.3)
Sodium: 137 mmol/L (ref 135–146)
Total Bilirubin: 0.3 mg/dL (ref 0.2–1.2)
Total Protein: 7.4 g/dL (ref 6.1–8.1)

## 2019-04-12 LAB — RPR: RPR Ser Ql: REACTIVE — AB

## 2019-04-12 LAB — FLUORESCENT TREPONEMAL AB(FTA)-IGG-BLD: Fluorescent Treponemal ABS: REACTIVE — AB

## 2019-04-12 LAB — RPR TITER: RPR Titer: 1:8 {titer} — ABNORMAL HIGH

## 2019-04-12 LAB — HIV-1 RNA QUANT-NO REFLEX-BLD
HIV 1 RNA Quant: <20 copies/mL — AB
HIV-1 RNA Quant, Log: <1.3 Log copies/mL — AB

## 2019-04-17 ENCOUNTER — Other Ambulatory Visit: Payer: Self-pay

## 2019-04-17 ENCOUNTER — Ambulatory Visit: Payer: Self-pay | Admitting: Infectious Disease

## 2019-04-18 ENCOUNTER — Ambulatory Visit: Payer: Self-pay | Admitting: Infectious Disease

## 2019-04-24 ENCOUNTER — Ambulatory Visit: Payer: Self-pay | Admitting: Infectious Disease

## 2019-04-24 ENCOUNTER — Telehealth: Payer: Self-pay | Admitting: Pharmacy Technician

## 2019-04-24 ENCOUNTER — Other Ambulatory Visit: Payer: Self-pay

## 2019-04-24 MED ORDER — SYMTUZA 800-150-200-10 MG PO TABS: 1.0000 | ORAL_TABLET | Freq: Every day | ORAL | 5 refills | Status: DC

## 2019-04-24 NOTE — Telephone Encounter (Signed)
[facility] Patient Advocate Encounter  Gave [name] with [facility] the pharmacy information for [name] to get a 30 day supply of Symtuza at no charge.   BIN      [id number] PCN    [id number] GRP    [id number] ID        [id number]   Jason [name] CPhT Specialty Pharmacy Patient [facility] for Infectious Disease Phone: [phone] Fax:  [phone]

## 2019-05-21 ENCOUNTER — Encounter: Payer: Self-pay | Admitting: Infectious Disease

## 2019-05-21 ENCOUNTER — Ambulatory Visit (INDEPENDENT_AMBULATORY_CARE_PROVIDER_SITE_OTHER): Payer: PRIVATE HEALTH INSURANCE | Admitting: Infectious Disease

## 2019-05-21 ENCOUNTER — Other Ambulatory Visit: Payer: Self-pay

## 2019-05-21 DIAGNOSIS — A563 Chlamydial infection of anus and rectum: Secondary | ICD-10-CM

## 2019-05-21 DIAGNOSIS — Z8619 Personal history of other infectious and parasitic diseases: Secondary | ICD-10-CM

## 2019-05-21 DIAGNOSIS — F331 Major depressive disorder, recurrent, moderate: Secondary | ICD-10-CM | POA: Diagnosis not present

## 2019-05-21 DIAGNOSIS — R635 Abnormal weight gain: Secondary | ICD-10-CM | POA: Diagnosis not present

## 2019-05-21 DIAGNOSIS — B20 Human immunodeficiency virus [HIV] disease: Secondary | ICD-10-CM

## 2019-05-21 HISTORY — DX: Abnormal weight gain: R63.5

## 2019-05-21 NOTE — Progress Notes (Signed)
Virtual Visit via Telephone Note  I connected with Katha Hamming on 05/21/19 at 11:15 AM EST by telephone and verified that I am speaking with the correct person using two identifiers.  Location: Patient: Home Provider: [facility]   I discussed the limitations, risks, security and privacy concerns of performing an evaluation and management service by telephone and the availability of in person appointments. I also discussed with the patient that there may be a patient responsible charge related to this service. The patient expressed understanding and agreed to proceed.   History of Present Illness:  Jason [name] is a 54 year old Caucasian man living with HIV that is been peripheral trolled on Symtuza.  He has noticed worsening weight gain since the pandemic began and thought that this might be related to [facility] though he has been on Symtuza for years.  He did on close questioning about what he is eating ingesting a fair amount of sodas that have sugar in them.  He says he is eating less but I suspect the high sugar containing high carbohydrate load that he is getting every day is not helping with the weight.  Certainly open to working him up for other causes of weight gain such as thyroid dysfunction or excess cortisol.  The [facility] itself is not been associated with weight gain in comparison to some of the other antiretroviral classes such as the integrates strand transfer inhibitors.  Review of systems as in history of present illness otherwise 12 point of systems is negative  Past Medical History:  Diagnosis Date  . AIDS ([place]) 01/18/2016  . Bloating symptom 10/11/2017  . Depression 01/18/2016  . Dysphagia 07/19/2016  . Facial rash 03/03/2016  . Hepatitis C   . HIV positive ([place])   . Inguinal hernia 01/18/2017  . Left elbow pain 08/15/2018  . Malar rash 01/18/2017  . Right knee pain 10/11/2017  . Scrotal ulcer 12/05/2018  . Thrush 03/03/2016  . Weight gain 05/21/2019  . Weight gain 05/21/2019     Past Surgical History:  Procedure Laterality Date  . ESOPHAGOGASTRODUODENOSCOPY (EGD) WITH PROPOFOL N/A 09/10/2016   Procedure: ESOPHAGOGASTRODUODENOSCOPY (EGD) WITH PROPOFOL;  Surgeon: Clarene Essex, MD;  Location: [facility];  Service: Endoscopy;  Laterality: N/A;    No family history on file.    Social History   Socioeconomic History  . Marital status: Single    Spouse name: Not on file  . Number of children: Not on file  . Years of education: Not on file  . Highest education level: Not on file  Occupational History  . Not on file  Tobacco Use  . Smoking status: Never Smoker  . Smokeless tobacco: Never Used  Substance and Sexual Activity  . Alcohol use: No    Comment: rare  . Drug use: Yes    Types: Marijuana  . Sexual activity: Not on file  Other Topics Concern  . Not on file  Social History Narrative  . Not on file   Social Determinants of Health   Financial Resource Strain:   . Difficulty of Paying Living Expenses: Not on file  Food Insecurity:   . Worried About Charity fundraiser in the Last Year: Not on file  . Ran Out of Food in the Last Year: Not on file  Transportation Needs:   . Lack of Transportation (Medical): Not on file  . Lack of Transportation (Non-Medical): Not on file  Physical Activity:   . Days of Exercise per Week: Not on file  . Minutes  of Exercise per Session: Not on file  Stress:   . Feeling of Stress : Not on file  Social Connections:   . Frequency of Communication with Friends and Family: Not on file  . Frequency of Social Gatherings with Friends and Family: Not on file  . Attends Religious Services: Not on file  . Active Member of Clubs or Organizations: Not on file  . Attends Banker Meetings: Not on file  . Marital Status: Not on file    Allergies  Allergen Reactions  . Codeine Itching     Current Outpatient Medications:  .  Darunavir-Cobicisctat-Emtricitabine-Tenofovir Alafenamide (SYMTUZA)  800-150-200-10 MG TABS, Take 1 tablet by mouth daily with breakfast., Disp: 30 tablet, Rfl: 5 .  ondansetron (ZOFRAN ODT) 4 MG disintegrating tablet, Take 1 tablet (4 mg total) by mouth every 8 (eight) hours as needed for nausea or vomiting. (Patient not taking: Reported on 05/21/2019), Disp: 20 tablet, Rfl: 0    Observations/Objective:  Other than having problems with weight gain present appears to be doing quite well and is in great spirits and is taking his antiretrovirals and how adherent manner.   Assessment and Plan:  Weight gain: I have recommended carbohydrate restriction in the interim I am also check him for cortisol as well as TSH and T4.  HIV disease well-controlled on Symtuza which he should continue.  History of rectal chlamydia we will recheck him for chlamydia and gonorrhea at his next visit through urine and oropharynx and rectal testing.  Depression is well controlled. Follow Up Instructions:    I discussed the assessment and treatment plan with the patient. The patient was provided an opportunity to ask questions and all were answered. The patient agreed with the plan and demonstrated an understanding of the instructions.   The patient was advised to call back or seek an in-person evaluation if the symptoms worsen or if the condition fails to improve as anticipated.  I provided 22 minutes of non-face-to-face time during this encounter.   Acey Lav, MD

## 2019-06-05 ENCOUNTER — Other Ambulatory Visit: Payer: PRIVATE HEALTH INSURANCE

## 2019-07-10 ENCOUNTER — Encounter: Payer: Self-pay | Admitting: Infectious Disease

## 2019-07-11 ENCOUNTER — Telehealth: Payer: Self-pay | Admitting: Pharmacy Technician

## 2019-07-11 NOTE — Telephone Encounter (Signed)
[facility] Patient Advocate Encounter  Patient's application was sent to [facility] in early March but not processed. The application has since been resent. Patient picked up one 30-count bottle today from the clinic to bridge him.  Beulah Gandy, CPhT Specialty Pharmacy Patient [facility] for Infectious Disease Phone: [phone] Fax: [phone] 07/11/2019 2:22 PM

## 2019-09-09 ENCOUNTER — Other Ambulatory Visit: Payer: Self-pay

## 2019-09-09 DIAGNOSIS — R635 Abnormal weight gain: Secondary | ICD-10-CM

## 2019-09-09 DIAGNOSIS — B20 Human immunodeficiency virus [HIV] disease: Secondary | ICD-10-CM

## 2019-09-10 LAB — URINE CYTOLOGY ANCILLARY ONLY
Chlamydia: NEGATIVE
Comment: NEGATIVE
Comment: NORMAL
Neisseria Gonorrhea: NEGATIVE

## 2019-09-10 LAB — T-HELPER CELL (CD4) - (RCID CLINIC ONLY)
CD4 % Helper T Cell: 15 % — ABNORMAL LOW (ref 33–65)
CD4 T Cell Abs: 211 /uL — ABNORMAL LOW (ref 400–1790)

## 2019-09-11 LAB — COMPLETE METABOLIC PANEL WITH GFR
AG Ratio: 1.5 (calc) (ref 1.0–2.5)
ALT: 12 U/L (ref 9–46)
AST: 16 U/L (ref 10–35)
Albumin: 4.2 g/dL (ref 3.6–5.1)
Alkaline phosphatase (APISO): 78 U/L (ref 35–144)
BUN: 23 mg/dL (ref 7–25)
CO2: 24 mmol/L (ref 20–32)
Calcium: 9.3 mg/dL (ref 8.6–10.3)
Chloride: 106 mmol/L (ref 98–110)
Creat: 0.99 mg/dL (ref 0.70–1.33)
GFR, Est African American: 100 mL/min/{1.73_m2} (ref >=60)
GFR, Est Non African American: 86 mL/min/{1.73_m2} (ref >=60)
Globulin: 2.8 g/dL (calc) (ref 1.9–3.7)
Glucose, Bld: 108 mg/dL — ABNORMAL HIGH (ref 65–99)
Potassium: 3.7 mmol/L (ref 3.5–5.3)
Sodium: 137 mmol/L (ref 135–146)
Total Bilirubin: 0.5 mg/dL (ref 0.2–1.2)
Total Protein: 7 g/dL (ref 6.1–8.1)

## 2019-09-11 LAB — CBC WITH DIFFERENTIAL/PLATELET
Absolute Monocytes: 785 cells/uL (ref 200–950)
Basophils Absolute: 18 cells/uL (ref 0–200)
Basophils Relative: 0.3 %
Eosinophils Absolute: 207 cells/uL (ref 15–500)
Eosinophils Relative: 3.5 %
HCT: 41.1 % (ref 38.5–50.0)
Hemoglobin: 13.7 g/dL (ref 13.2–17.1)
Lymphs Abs: 1339 cells/uL (ref 850–3900)
MCH: 31.4 pg (ref 27.0–33.0)
MCHC: 33.3 g/dL (ref 32.0–36.0)
MCV: 94.1 fL (ref 80.0–100.0)
MPV: 9.8 fL (ref 7.5–12.5)
Monocytes Relative: 13.3 %
Neutro Abs: 3552 cells/uL (ref 1500–7800)
Neutrophils Relative %: 60.2 %
Platelets: 273 10*3/uL (ref 140–400)
RBC: 4.37 10*6/uL (ref 4.20–5.80)
RDW: 12.6 % (ref 11.0–15.0)
Total Lymphocyte: 22.7 %
WBC: 5.9 10*3/uL (ref 3.8–10.8)

## 2019-09-11 LAB — HIV-1 RNA QUANT-NO REFLEX-BLD
HIV 1 RNA Quant: <20 copies/mL — AB
HIV-1 RNA Quant, Log: <1.3 Log copies/mL — AB

## 2019-09-11 LAB — RPR: RPR Ser Ql: REACTIVE — AB

## 2019-09-11 LAB — TSH+FREE T4: TSH W/REFLEX TO FT4: 4.72 mIU/L — ABNORMAL HIGH (ref 0.40–4.50)

## 2019-09-11 LAB — T4, FREE: Free T4: 1.1 ng/dL (ref 0.8–1.8)

## 2019-09-11 LAB — FLUORESCENT TREPONEMAL AB(FTA)-IGG-BLD: Fluorescent Treponemal ABS: REACTIVE — AB

## 2019-09-11 LAB — CORTISOL: Cortisol, Plasma: 12.7 ug/dL

## 2019-09-11 LAB — RPR TITER: RPR Titer: 1:4 {titer} — ABNORMAL HIGH

## 2019-10-02 ENCOUNTER — Other Ambulatory Visit: Payer: Self-pay

## 2019-10-02 ENCOUNTER — Encounter: Payer: Self-pay | Admitting: Infectious Disease

## 2019-10-02 ENCOUNTER — Ambulatory Visit (INDEPENDENT_AMBULATORY_CARE_PROVIDER_SITE_OTHER): Payer: Self-pay | Admitting: Infectious Disease

## 2019-10-02 VITALS — BP 143/85 | HR 88 | Temp 97.5°F | Wt 160.4 lb

## 2019-10-02 DIAGNOSIS — M1712 Unilateral primary osteoarthritis, left knee: Secondary | ICD-10-CM | POA: Insufficient documentation

## 2019-10-02 DIAGNOSIS — R635 Abnormal weight gain: Secondary | ICD-10-CM

## 2019-10-02 DIAGNOSIS — B20 Human immunodeficiency virus [HIV] disease: Secondary | ICD-10-CM

## 2019-10-02 DIAGNOSIS — M175 Other unilateral secondary osteoarthritis of knee: Secondary | ICD-10-CM

## 2019-10-02 DIAGNOSIS — Z8619 Personal history of other infectious and parasitic diseases: Secondary | ICD-10-CM

## 2019-10-02 HISTORY — DX: Unilateral primary osteoarthritis, left knee: M17.12

## 2019-10-02 MED ORDER — TRIAMCINOLONE ACETONIDE 0.5 % EX OINT
1.0000 | TOPICAL_OINTMENT | Freq: Two times a day (BID) | CUTANEOUS | 1 refills | Status: DC
Start: 2019-10-02 — End: 2020-01-20

## 2019-10-02 NOTE — Progress Notes (Signed)
Chief complaints: Intense left knee pain, rash on forearm  Subjective:    Patient ID: Jason [name], male    DOB: 10-05-1965, 54 y.o.   MRN: [id number]  HPI  Jason [name] is a 54 year old Caucasian man living with HIV that is been nicely controlled on Symtuza.  He is complaining of some intense left-sided knee pain that is been going on for well over a year and asked for referral for someone who could help him with this.  He emphasized that several family members that suffered from severe osteoarthritis.  Also has a pruritic rash on his forearm which is not responded to over-the-counter topical steroids.    Past Medical History:  Diagnosis Date   AIDS (HCC) 01/18/2016   Bloating symptom 10/11/2017   Depression 01/18/2016   Dysphagia 07/19/2016   Facial rash 03/03/2016   Hepatitis C    HIV positive (HCC)    Inguinal hernia 01/18/2017   Left elbow pain 08/15/2018   Malar rash 01/18/2017   Right knee pain 10/11/2017   Scrotal ulcer 12/05/2018   Thrush 03/03/2016   Weight gain 05/21/2019   Weight gain 05/21/2019    Past Surgical History:  Procedure Laterality Date   ESOPHAGOGASTRODUODENOSCOPY (EGD) WITH PROPOFOL N/A 09/10/2016   Procedure: ESOPHAGOGASTRODUODENOSCOPY (EGD) WITH PROPOFOL;  Surgeon: Vida Rigger, MD;  Location: [facility] ENDOSCOPY;  Service: Endoscopy;  Laterality: N/A;    No family history on file.    Social History   Socioeconomic History   Marital status: Single    Spouse name: Not on file   Number of children: Not on file   Years of education: Not on file   Highest education level: Not on file  Occupational History   Not on file  Tobacco Use   Smoking status: Never Smoker   Smokeless tobacco: Never Used  Substance and Sexual Activity   Alcohol use: No    Comment: rare   Drug use: Yes    Types: Marijuana   Sexual activity: Not on file  Other Topics Concern   Not on file  Social History Narrative   Not on file   Social Determinants  of Health   Financial Resource Strain:    Difficulty of Paying Living Expenses:   Food Insecurity:    Worried About Programme researcher, broadcasting/film/video in the Last Year:    Barista in the Last Year:   Transportation Needs:    Freight forwarder (Medical):    Lack of Transportation (Non-Medical):   Physical Activity:    Days of Exercise per Week:    Minutes of Exercise per Session:   Stress:    Feeling of Stress :   Social Connections:    Frequency of Communication with Friends and Family:    Frequency of Social Gatherings with Friends and Family:    Attends Religious Services:    Active Member of Clubs or Organizations:    Attends Engineer, structural:    Marital Status:     Allergies  Allergen Reactions   Codeine Itching     Current Outpatient Medications:    Darunavir-Cobicisctat-Emtricitabine-Tenofovir Alafenamide (SYMTUZA) 800-150-200-10 MG TABS, Take 1 tablet by mouth daily with breakfast., Disp: 30 tablet, Rfl: 5   ondansetron (ZOFRAN ODT) 4 MG disintegrating tablet, Take 1 tablet (4 mg total) by mouth every 8 (eight) hours as needed for nausea or vomiting. (Patient not taking: Reported on 05/21/2019), Disp: 20 tablet, Rfl: 0   Review of Systems  Constitutional: Negative for  chills and fever.  HENT: Negative for congestion and sore throat.   Eyes: Negative for photophobia.  Respiratory: Negative for cough, shortness of breath and wheezing.   Cardiovascular: Negative for chest pain, palpitations and leg swelling.  Gastrointestinal: Negative for abdominal pain, blood in stool, constipation, diarrhea, nausea and vomiting.  Genitourinary: Negative for dysuria, flank pain and hematuria.  Musculoskeletal: Positive for arthralgias. Negative for back pain and myalgias.  Skin: Positive for rash.  Neurological: Negative for dizziness, weakness and headaches.  Hematological: Does not bruise/bleed easily.  Psychiatric/Behavioral: Negative for agitation,  confusion, self-injury and suicidal ideas.       Objective:   Physical Exam Constitutional:      General: He is not in acute distress.    Appearance: He is well-developed. He is not diaphoretic.  HENT:     Head: Normocephalic and atraumatic.     Mouth/Throat:     Pharynx: No oropharyngeal exudate.  Eyes:     General: No scleral icterus.    Conjunctiva/sclera: Conjunctivae normal.  Cardiovascular:     Rate and Rhythm: Normal rate and regular rhythm.  Pulmonary:     Effort: Pulmonary effort is normal. No respiratory distress.     Breath sounds: No wheezing.  Abdominal:     General: There is no distension.  Musculoskeletal:     Cervical back: Normal range of motion and neck supple.     Right knee: Normal.     Left knee: No swelling, deformity, effusion, erythema, ecchymosis or lacerations. Normal range of motion. Tenderness present. No medial joint line tenderness. No LCL laxity, MCL laxity or ACL laxity.Normal pulse.     Instability Tests: Anterior drawer test negative. Posterior drawer test negative. Anterior Lachman test negative. Medial McMurray test negative.  Skin:    General: Skin is warm and dry.     Coloration: Skin is not pale.     Findings: No erythema or rash.  Neurological:     General: No focal deficit present.     Mental Status: He is alert and oriented to person, place, and time.     Motor: No abnormal muscle tone.     Coordination: Coordination normal.  Psychiatric:        Mood and Affect: Mood normal.        Behavior: Behavior normal.        Thought Content: Thought content normal.        Judgment: Judgment normal.   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  Otherwise am not sure how I am going to refer him for films or evaluation by orthopedic surgeon  HIV disease continue Symtuza.  Weight gain:  Seems to have stabilized in fact he lost some weight recently TSH was elevated but T4 normal.

## 2019-10-08 ENCOUNTER — Ambulatory Visit: Payer: Self-pay

## 2019-11-20 ENCOUNTER — Encounter: Payer: Self-pay | Admitting: Infectious Disease

## 2019-12-26 ENCOUNTER — Telehealth: Payer: Self-pay

## 2019-12-26 NOTE — Telephone Encounter (Signed)
He needs to come in and see a provider

## 2019-12-26 NOTE — Telephone Encounter (Signed)
Patient called office today stating rash has gotten worse since last visit. Has tried topical corticosteroid, but has not had any relief. Rash is bad on both feet and hands. States it is spreading up his arm. Will forward message to provider to advise on referral for dermatology.  Jason [name], [place]

## 2019-12-26 NOTE — Telephone Encounter (Signed)
Called patient regarding MD's message. Patient is confused if he should see a dermatologist or PCP regarding rash. Offered appointment with provider at [facility], but would like to hold off until he speaks with case manager. Will call office on Monday if appointment is needed.  Jason [name], [place]

## 2020-01-20 ENCOUNTER — Other Ambulatory Visit: Payer: Self-pay

## 2020-01-20 ENCOUNTER — Encounter: Payer: Self-pay | Admitting: Family Medicine

## 2020-01-20 ENCOUNTER — Ambulatory Visit: Payer: Self-pay | Attending: Family Medicine | Admitting: Family Medicine

## 2020-01-20 VITALS — BP 120/78 | HR 71 | Temp 97.3°F | Ht 67.5 in | Wt 174.6 lb

## 2020-01-20 DIAGNOSIS — Z7689 Persons encountering health services in other specified circumstances: Secondary | ICD-10-CM

## 2020-01-20 DIAGNOSIS — R21 Rash and other nonspecific skin eruption: Secondary | ICD-10-CM

## 2020-01-20 DIAGNOSIS — G8929 Other chronic pain: Secondary | ICD-10-CM

## 2020-01-20 DIAGNOSIS — M25562 Pain in left knee: Secondary | ICD-10-CM

## 2020-01-20 MED ORDER — TRIAMCINOLONE ACETONIDE 0.5 % EX OINT: 1.0000 "application " | TOPICAL_OINTMENT | Freq: Two times a day (BID) | CUTANEOUS | 1 refills | Status: DC

## 2020-01-20 MED ORDER — PERMETHRIN 5 % EX CREA: TOPICAL_CREAM | CUTANEOUS | 1 refills | Status: DC

## 2020-01-20 NOTE — Progress Notes (Signed)
New Patient Office Visit  Subjective:  Patient ID: Jason [name], male    DOB: Feb 17, 1966  Age: 54 y.o. MRN: [id number]  CC:  Chief Complaint  Patient presents with  . Establish Care    skin rash    HPI Jason [name], 54 yo male new to the practice who presents secondary to a recurrent rash that is now spreading from his feet up his legs and on the bilateral arms from the hands/wrists up to the creases of his arms. Rash is very itchy.  Itching is worse at night.  He was previously prescribed triamcinolone cream by his infectious disease doctor and he states that the cream helped briefly but rash continues to spread.  He also has chronic left pain which is worse at the end of the work day and also after being seated and then trying to stand.  Patient is employed as a Airline pilot.  Grandfather had severe OA.  Patient currently takes over-the-counter pain medications as needed but would like to see a specialist if possible regarding his chronic knee pain.  Patient with history of HIV and will get his influenza immunization at his upcoming appointment with his infectious disease specialist.  Past Medical History:  Diagnosis Date  . AIDS (HCC) 01/18/2016  . Bloating symptom 10/11/2017  . Depression 01/18/2016  . Dysphagia 07/19/2016  . Facial rash 03/03/2016  . Hepatitis C   . HIV positive (HCC)   . Inguinal hernia 01/18/2017  . Left elbow pain 08/15/2018  . Malar rash 01/18/2017  . Osteoarthritis of left knee 10/02/2019  . Right knee pain 10/11/2017  . Scrotal ulcer 12/05/2018  . Thrush 03/03/2016  . Weight gain 05/21/2019  . Weight gain 05/21/2019    Past Surgical History:  Procedure Laterality Date  . ESOPHAGOGASTRODUODENOSCOPY (EGD) WITH PROPOFOL N/A 09/10/2016   Procedure: ESOPHAGOGASTRODUODENOSCOPY (EGD) WITH PROPOFOL;  Surgeon: Vida Rigger, MD;  Location: [facility] ENDOSCOPY;  Service: Endoscopy;  Laterality: N/A;    Family History  Problem Relation Age of Onset  . Kidney disease Father      Social History   Socioeconomic History  . Marital status: Single    Spouse name: Not on file  . Number of children: Not on file  . Years of education: Not on file  . Highest education level: Not on file  Occupational History  . Not on file  Tobacco Use  . Smoking status: Never Smoker  . Smokeless tobacco: Never Used  Substance and Sexual Activity  . Alcohol use: No    Comment: rare  . Drug use: Yes    Types: Marijuana  . Sexual activity: Not Currently    Partners: Male  Other Topics Concern  . Not on file  Social History Narrative  . Not on file   Social Determinants of Health   Financial Resource Strain:   . Difficulty of Paying Living Expenses: Not on file  Food Insecurity:   . Worried About Programme researcher, broadcasting/film/video in the Last Year: Not on file  . Ran Out of Food in the Last Year: Not on file  Transportation Needs:   . Lack of Transportation (Medical): Not on file  . Lack of Transportation (Non-Medical): Not on file  Physical Activity:   . Days of Exercise per Week: Not on file  . Minutes of Exercise per Session: Not on file  Stress:   . Feeling of Stress : Not on file  Social Connections:   . Frequency of Communication with Friends and Family:  Not on file  . Frequency of Social Gatherings with Friends and Family: Not on file  . Attends Religious Services: Not on file  . Active Member of Clubs or Organizations: Not on file  . Attends Banker Meetings: Not on file  . Marital Status: Not on file  Intimate Partner Violence:   . Fear of Current or Ex-Partner: Not on file  . Emotionally Abused: Not on file  . Physically Abused: Not on file  . Sexually Abused: Not on file    ROS Review of Systems  Constitutional: Positive for fatigue. Negative for chills and fever.  Respiratory: Negative for cough and shortness of breath.   Cardiovascular: Negative for chest pain and palpitations.  Gastrointestinal: Negative for abdominal pain and nausea.   Endocrine: Negative for polydipsia, polyphagia and polyuria.  Genitourinary: Negative for dysuria and frequency.  Musculoskeletal: Positive for arthralgias and gait problem.  Skin: Positive for rash. Negative for wound.  Neurological: Negative for dizziness and headaches.  Hematological: Negative for adenopathy. Does not bruise/bleed easily.    Objective:   Today's Vitals: BP 120/78 (BP Location: Left Arm, Patient Position: Sitting)   Pulse 71   Temp (!) 97.3 F (36.3 C)   Ht 5' 7.5" (1.715 m)   Wt 174 lb 9.6 oz (79.2 kg)   SpO2 98%   BMI 26.94 kg/m   Physical Exam Vitals and nursing note reviewed.  Constitutional:      General: He is not in acute distress.    Appearance: Normal appearance.  Cardiovascular:     Rate and Rhythm: Normal rate and regular rhythm.  Pulmonary:     Effort: Pulmonary effort is normal.     Breath sounds: Normal breath sounds.  Musculoskeletal:        General: Tenderness present.     Right lower leg: No edema.     Left lower leg: No edema.     Comments: Bilateral joint line tenderness of the left knee  Skin:    Findings: Rash present.     Comments: Patient with maculopapular rash on the tops of the feet/ankles which extends up the lower legs and patient with maculopapular rash on the bilateral hands, forearms and rash areas concentrated at the bilateral antecubital fossa's.  Neurological:     General: No focal deficit present.     Mental Status: He is alert and oriented to person, place, and time.  Psychiatric:        Mood and Affect: Mood normal.        Behavior: Behavior normal.     Assessment & Plan:  1. Encounter to establish care 2. Rash Due to the appearance of rash, methadone which rash has spread from the feet to lower legs and behind the knees as well as from the hand up the arms to the antecubital fossa and since itching is worse at night, suspect the patient may have scabies as a source of his itchy rash continues to spread.   Prescription provided for Elimite cream with instructions on use.  Patient has been asked to return to the clinic for reevaluation if the rash is not improved within the next 2 to 3 weeks.  He is also given triamcinolone cream if rash is not improving.  Discussed with patient that if there is no improvement with use of Elimite cream that he will likely be prescribed a course of oral steroids to help with the itching as well as dermatology referral. - permethrin (ELIMITE) 5 % cream;  Apply all over avoiding eyes/nose/mouth; let dry and wash off after at least 8 hours; repeat in 1-2 weeks if needed  Dispense: 60 g; Refill: 1 - triamcinolone ointment (KENALOG) 0.5 %; Apply 1 application topically 2 (two) times daily. As needed  Dispense: 30 g; Refill: 1  3. Chronic pain of left knee Patient likely with osteoarthritis of the left knee.  He may continue the use of over-the-counter Tylenol or ibuprofen for pain and also suggested use of over-the-counter diclofenac.  Patient is encouraged to apply for the [facility] discount program and referral will also be made for patient to follow-up with orthopedics. - AMB referral to orthopedics   Outpatient Encounter Medications as of 01/20/2020  Medication Sig  . Darunavir-Cobicisctat-Emtricitabine-Tenofovir Alafenamide (SYMTUZA) 800-150-200-10 MG TABS Take 1 tablet by mouth daily with breakfast.  . triamcinolone ointment (KENALOG) 0.5 % Apply 1 application topically 2 (two) times daily.  . ondansetron (ZOFRAN ODT) 4 MG disintegrating tablet Take 1 tablet (4 mg total) by mouth every 8 (eight) hours as needed for nausea or vomiting. (Patient not taking: Reported on 05/21/2019)   No facility-administered encounter medications on file as of 01/20/2020.    Follow-up: No follow-ups on file.   Cain Saupe, MD

## 2020-01-20 NOTE — Progress Notes (Signed)
Has all rash all over body- Left knee pain-achy pain Out of triamcinolone cream

## 2020-02-10 ENCOUNTER — Ambulatory Visit: Payer: Self-pay | Admitting: Family Medicine

## 2020-03-02 ENCOUNTER — Encounter: Payer: Self-pay | Admitting: Family Medicine

## 2020-03-11 ENCOUNTER — Ambulatory Visit: Payer: Self-pay | Attending: Family Medicine | Admitting: Family Medicine

## 2020-03-11 ENCOUNTER — Encounter: Payer: Self-pay | Admitting: Family Medicine

## 2020-03-11 ENCOUNTER — Other Ambulatory Visit: Payer: Self-pay

## 2020-03-11 VITALS — BP 106/66 | HR 64 | Temp 98.2°F | Ht 68.0 in | Wt 175.0 lb

## 2020-03-11 DIAGNOSIS — L240 Irritant contact dermatitis due to detergents: Secondary | ICD-10-CM

## 2020-03-11 DIAGNOSIS — Z1211 Encounter for screening for malignant neoplasm of colon: Secondary | ICD-10-CM

## 2020-03-11 DIAGNOSIS — K4091 Unilateral inguinal hernia, without obstruction or gangrene, recurrent: Secondary | ICD-10-CM

## 2020-03-11 DIAGNOSIS — Z0000 Encounter for general adult medical examination without abnormal findings: Secondary | ICD-10-CM

## 2020-03-11 DIAGNOSIS — Z13228 Encounter for screening for other metabolic disorders: Secondary | ICD-10-CM

## 2020-03-11 MED ORDER — TRIAMCINOLONE ACETONIDE 0.5 % EX OINT: 1.0000 "application " | TOPICAL_OINTMENT | Freq: Two times a day (BID) | CUTANEOUS | 2 refills | Status: DC | PRN

## 2020-03-11 NOTE — Patient Instructions (Signed)

## 2020-03-11 NOTE — Progress Notes (Signed)
Subjective:  Patient ID: Jason [name], male    DOB: 12-Sep-1965  Age: 54 y.o. MRN: [id number]  CC: Annual Exam   HPI Jason [name] is a 54 year old male with a history of HIV (followed by infectious disease) who is here for an annual physical exam.  He has a rash on the forearms for which he received Elimite and triamcinolone from his PCP and rash improved but then returned once course was completed. 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 Past Medical History:  Diagnosis Date  . AIDS (HCC) 01/18/2016  . Bloating symptom 10/11/2017  . Depression 01/18/2016  . Dysphagia 07/19/2016  . Facial rash 03/03/2016  . Hepatitis C   . HIV positive (HCC)   . Inguinal hernia 01/18/2017  . Left elbow pain 08/15/2018  . Malar rash 01/18/2017  . Osteoarthritis of left knee 10/02/2019  . Right knee pain 10/11/2017  . Scrotal ulcer 12/05/2018  . Thrush 03/03/2016  . Weight gain 05/21/2019  . Weight gain 05/21/2019    Past Surgical History:  Procedure Laterality Date  . ESOPHAGOGASTRODUODENOSCOPY (EGD) WITH PROPOFOL N/A 09/10/2016   Procedure: ESOPHAGOGASTRODUODENOSCOPY (EGD) WITH PROPOFOL;  Surgeon: Vida Rigger, MD;  Location: [facility] ENDOSCOPY;  Service: Endoscopy;  Laterality: N/A;    Family History  Problem Relation Age of Onset  . Kidney disease Father     Allergies  Allergen Reactions  . Codeine Itching    Outpatient Medications Prior to Visit  Medication Sig Dispense Refill  . Darunavir-Cobicisctat-Emtricitabine-Tenofovir Alafenamide (SYMTUZA) 800-150-200-10 MG TABS Take 1 tablet by mouth daily with breakfast. 30 tablet 5  . triamcinolone ointment (KENALOG) 0.5 % Apply 1 application topically 2 (two) times daily. As needed 30 g 1  . ondansetron (ZOFRAN ODT) 4 MG disintegrating tablet Take 1 tablet (4 mg total) by mouth every 8 (eight) hours as needed for nausea or vomiting. (Patient not taking: No sig reported) 20 tablet 0  . permethrin (ELIMITE) 5 % cream Apply all over  avoiding eyes/nose/mouth; let dry and wash off after at least 8 hours; repeat in 1-2 weeks if needed (Patient not taking: Reported on 03/11/2020) 60 g 1   No facility-administered medications prior to visit.     ROS Review of Systems  Constitutional: Negative for activity change and appetite change.  HENT: Negative for sinus pressure and sore throat.   Eyes: Negative for visual disturbance.  Respiratory: Negative for cough, chest tightness and shortness of breath.   Cardiovascular: Negative for chest pain and leg swelling.  Gastrointestinal: Negative for abdominal distention, abdominal pain, constipation and diarrhea.  Endocrine: Negative.   Genitourinary: Negative for dysuria.  Musculoskeletal: Negative for joint swelling and myalgias.  Skin: Positive for rash.  Allergic/Immunologic: Negative.   Neurological: Negative for weakness, light-headedness and numbness.  Psychiatric/Behavioral: Negative for dysphoric mood and suicidal ideas.    Objective:  BP 106/66   Pulse 64   Temp 98.2 F (36.8 C) (Oral)   Ht 5\' 8"  (1.727 m)   Wt 175 lb (79.4 kg)   SpO2 98%   BMI 26.61 kg/m   BP/Weight 03/11/2020 01/20/2020 10/02/2019  Systolic BP 106 120 143  Diastolic BP 66 78 85  Wt. (Lbs) 175 174.6 160.4  BMI 26.61 26.94 24.39      Physical Exam Constitutional:      Appearance: He is well-developed and well-nourished.  HENT:     Head: Normocephalic and atraumatic.     Right Ear: External ear normal.  Left Ear: External ear normal.  Eyes:     Extraocular Movements: EOM normal.     Conjunctiva/sclera: Conjunctivae normal.     Pupils: Pupils are equal, round, and reactive to light.  Neck:     Trachea: No tracheal deviation.  Cardiovascular:     Rate and Rhythm: Normal rate and regular rhythm.     Heart sounds: Normal heart sounds. No murmur heard.   Pulmonary:     Effort: Pulmonary effort is normal. No respiratory distress.     Breath sounds: Normal breath sounds. No  wheezing.  Chest:     Chest wall: No tenderness.  Abdominal:     General: Bowel sounds are normal.     Palpations: Abdomen is soft. There is no mass.     Tenderness: There is no abdominal tenderness.  Genitourinary:    Penis: Normal.      Testes:        Left: Mass (inguinal hernia, reducible) present.  Musculoskeletal:        General: No tenderness or edema. Normal range of motion.     Cervical back: Normal range of motion and neck supple.  Skin:    Comments: Raised erythematous scaly rash on flexor aspect of both forearms  Neurological:     Mental Status: He is alert and oriented to person, place, and time.  Psychiatric:        Mood and Affect: Mood and affect normal.     CMP Latest Ref Rng & Units 09/09/2019 04/04/2019 12/05/2018  Glucose 65 - 99 mg/dL 528(U) 132(G) 92  BUN 7 - 25 mg/dL 23 14 13   Creatinine 0.70 - 1.33 mg/dL 4.01 0.27  Sodium 135 - 146 mmol/L 137 137 140  Potassium 3.5 - 5.3 mmol/L 3.7 4.1 4.2  Chloride 98 - 110 mmol/L 106 104 106  CO2 20 - 32 mmol/L 24 21 22   Calcium 8.6 - 10.3 mg/dL 9.3 9.6 9.2  Total Protein 6.1 - 8.1 g/dL 7.0 7.4 7.1  Total Bilirubin 0.2 - 1.2 mg/dL 0.5 0.3 0.4  Alkaline Phos 40 - 115 U/L - - -  AST 10 - 35 U/L 16 20 15   ALT 9 - 46 U/L 12 13 11     Lipid Panel     Component Value Date/Time   CHOL 178 01/31/2018 1021   TRIG 103 01/31/2018 1021   HDL 42 01/31/2018 1021   CHOLHDL 4.2 01/31/2018 1021   VLDL 16 11/03/2016 0930   LDLCALC 115 (H) 01/31/2018 1021    CBC    Component Value Date/Time   WBC 5.9 09/09/2019 1045   RBC 4.37 09/09/2019 1045   HGB 13.7 09/09/2019 1045   HCT 41.1 09/09/2019 1045   PLT 273 09/09/2019 1045   MCV 94.1 09/09/2019 1045   MCH 31.4 09/09/2019 1045   MCHC 33.3 09/09/2019 1045   RDW 12.6 09/09/2019 1045   LYMPHSABS 1,339 09/09/2019 1045   MONOABS 1,012 (H) 11/03/2016 0930   EOSABS 207 09/09/2019 1045   BASOSABS 18 09/09/2019 1045    Lab Results  Component Value Date   HGBA1C 5.3  08/07/2017    Assessment & Plan:  1. Annual physical exam Counseled on 150 minutes of exercise per week, healthy eating (including decreased daily intake of saturated fats, cholesterol, added sugars, sodium), routine healthcare maintenance. - Lipid panel; Future  2. Screening for colon cancer - Ambulatory referral to Gastroenterology  3. 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  Dispense: 30 g; Refill: 2  4. Screening for metabolic disorder - Hemoglobin A1c; Future  5. Unilateral recurrent inguinal hernia without obstruction or gangrene No evidence of strangulation or obstruction and he is not bothered by this No indication for referral at this time but he has been advised of warning symptoms and he knows to seek help if this occurs    Meds ordered this encounter  Medications  . triamcinolone ointment (KENALOG) 0.5 %    Sig: Apply 1 application topically 2 (two) times daily as needed.    Dispense:  30 g    Refill:  2    Follow-up: Return in about 6 months (around 09/09/2020) for Coordination of care.       Hoy Register, MD, FAAFP. [facility] [place], [place] [phone]   03/11/2020, 10:55 AM

## 2020-03-11 NOTE — Progress Notes (Signed)
Has red rash all over his arm and they itch real bad.

## 2020-03-25 ENCOUNTER — Other Ambulatory Visit: Payer: Self-pay

## 2020-03-25 DIAGNOSIS — B20 Human immunodeficiency virus [HIV] disease: Secondary | ICD-10-CM

## 2020-03-26 LAB — T-HELPER CELL (CD4) - (RCID CLINIC ONLY)
CD4 % Helper T Cell: 17 % — ABNORMAL LOW (ref 33–65)
CD4 T Cell Abs: 238 /uL — ABNORMAL LOW (ref 400–1790)

## 2020-04-06 LAB — COMPLETE METABOLIC PANEL WITH GFR
AG Ratio: 1.5 (calc) (ref 1.0–2.5)
ALT: 10 U/L (ref 9–46)
AST: 15 U/L (ref 10–35)
Albumin: 4.4 g/dL (ref 3.6–5.1)
Alkaline phosphatase (APISO): 77 U/L (ref 35–144)
BUN: 20 mg/dL (ref 7–25)
CO2: 23 mmol/L (ref 20–32)
Calcium: 9.3 mg/dL (ref 8.6–10.3)
Chloride: 103 mmol/L (ref 98–110)
Creat: 0.9 mg/dL (ref 0.70–1.33)
GFR, Est African American: 111 mL/min/{1.73_m2} (ref >=60)
GFR, Est Non African American: 96 mL/min/{1.73_m2} (ref >=60)
Globulin: 3 g/dL (calc) (ref 1.9–3.7)
Glucose, Bld: 130 mg/dL — ABNORMAL HIGH (ref 65–99)
Potassium: 3.8 mmol/L (ref 3.5–5.3)
Sodium: 135 mmol/L (ref 135–146)
Total Bilirubin: 0.4 mg/dL (ref 0.2–1.2)
Total Protein: 7.4 g/dL (ref 6.1–8.1)

## 2020-04-06 LAB — CBC WITH DIFFERENTIAL/PLATELET
Absolute Monocytes: 806 cells/uL (ref 200–950)
Basophils Absolute: 29 cells/uL (ref 0–200)
Basophils Relative: 0.5 %
Eosinophils Absolute: 220 cells/uL (ref 15–500)
Eosinophils Relative: 3.8 %
HCT: 43.8 % (ref 38.5–50.0)
Hemoglobin: 15 g/dL (ref 13.2–17.1)
Lymphs Abs: 1322 cells/uL (ref 850–3900)
MCH: 32 pg (ref 27.0–33.0)
MCHC: 34.2 g/dL (ref 32.0–36.0)
MCV: 93.4 fL (ref 80.0–100.0)
MPV: 10.1 fL (ref 7.5–12.5)
Monocytes Relative: 13.9 %
Neutro Abs: 3422 cells/uL (ref 1500–7800)
Neutrophils Relative %: 59 %
Platelets: 273 10*3/uL (ref 140–400)
RBC: 4.69 10*6/uL (ref 4.20–5.80)
RDW: 12.6 % (ref 11.0–15.0)
Total Lymphocyte: 22.8 %
WBC: 5.8 10*3/uL (ref 3.8–10.8)

## 2020-04-06 LAB — LIPID PANEL
Cholesterol: 206 mg/dL — ABNORMAL HIGH (ref <200)
HDL: 46 mg/dL (ref >=40)
LDL Cholesterol (Calc): 121 mg/dL (calc) — ABNORMAL HIGH
Non-HDL Cholesterol (Calc): 160 mg/dL (calc) — ABNORMAL HIGH (ref <130)
Total CHOL/HDL Ratio: 4.5 (calc) (ref <5.0)
Triglycerides: 241 mg/dL — ABNORMAL HIGH (ref <150)

## 2020-04-06 LAB — HIV-1 RNA QUANT-NO REFLEX-BLD
HIV 1 RNA Quant: <20 Copies/mL
HIV-1 RNA Quant, Log: <1.3 Log cps/mL

## 2020-04-06 LAB — RPR: RPR Ser Ql: REACTIVE — AB

## 2020-04-06 LAB — RPR TITER: RPR Titer: 1:2 {titer} — ABNORMAL HIGH

## 2020-04-06 LAB — FLUORESCENT TREPONEMAL AB(FTA)-IGG-BLD: Fluorescent Treponemal ABS: REACTIVE — AB

## 2020-04-08 ENCOUNTER — Ambulatory Visit (INDEPENDENT_AMBULATORY_CARE_PROVIDER_SITE_OTHER): Payer: Self-pay | Admitting: Infectious Disease

## 2020-04-08 ENCOUNTER — Encounter: Payer: Self-pay | Admitting: Infectious Disease

## 2020-04-08 ENCOUNTER — Ambulatory Visit: Payer: Self-pay

## 2020-04-08 ENCOUNTER — Other Ambulatory Visit: Payer: Self-pay

## 2020-04-08 VITALS — BP 103/67 | HR 73 | Temp 98.1°F | Wt 173.0 lb

## 2020-04-08 DIAGNOSIS — R21 Rash and other nonspecific skin eruption: Secondary | ICD-10-CM

## 2020-04-08 DIAGNOSIS — R635 Abnormal weight gain: Secondary | ICD-10-CM

## 2020-04-08 DIAGNOSIS — Z8619 Personal history of other infectious and parasitic diseases: Secondary | ICD-10-CM

## 2020-04-08 DIAGNOSIS — B20 Human immunodeficiency virus [HIV] disease: Secondary | ICD-10-CM

## 2020-04-08 MED ORDER — DELSTRIGO 100-300-300 MG PO TABS: 1.0000 | ORAL_TABLET | Freq: Every day | ORAL | 11 refills | Status: DC

## 2020-04-08 NOTE — Progress Notes (Signed)
Chief complaints: Rash on arms is worse  Subjective:    Patient ID: Jason [name], male    DOB: [birth year], 55 y.o.   MRN: [id number]  HPI  Jason [name] is a 55 year old Caucasian man living with HIV that is been nicely controlled on Symtuza.    He continues to have persistence of rash which he has had my exam of the chart has been going on for years.  He was started on 3 pill regimen in [place] when started his ARVs.  He seems to have been taken off of DRV and switched to [place] to see if it would help with rash abut not clear if it did  I had changed him back to Symtuza due to problems with weight gain on INSTI  I can change him again today and will change to Delstrigo  He needs to see a Dermatologist.   Past Medical History:  Diagnosis Date  . AIDS (HCC) 01/18/2016  . Bloating symptom 10/11/2017  . Depression 01/18/2016  . Dysphagia 07/19/2016  . Facial rash 03/03/2016  . Hepatitis C   . HIV positive (HCC)   . Inguinal hernia 01/18/2017  . Left elbow pain 08/15/2018  . Malar rash 01/18/2017  . Osteoarthritis of left knee 10/02/2019  . Right knee pain 10/11/2017  . Scrotal ulcer 12/05/2018  . Thrush 03/03/2016  . Weight gain 05/21/2019  . Weight gain 05/21/2019    Past Surgical History:  Procedure Laterality Date  . ESOPHAGOGASTRODUODENOSCOPY (EGD) WITH PROPOFOL N/A 09/10/2016   Procedure: ESOPHAGOGASTRODUODENOSCOPY (EGD) WITH PROPOFOL;  Surgeon: Vida Rigger, MD;  Location: [facility] ENDOSCOPY;  Service: Endoscopy;  Laterality: N/A;    Family History  Problem Relation Age of Onset  . Kidney disease Father       Social History   Socioeconomic History  . Marital status: Single    Spouse name: Not on file  . Number of children: Not on file  . Years of education: Not on file  . Highest education level: Not on file  Occupational History  . Not on file  Tobacco Use  . Smoking status: Never Smoker  . Smokeless tobacco: Never Used  Substance and Sexual Activity  . Alcohol  use: No    Comment: rare  . Drug use: Yes    Types: Marijuana  . Sexual activity: Not Currently    Partners: Male  Other Topics Concern  . Not on file  Social History Narrative  . Not on file   Social Determinants of Health   Financial Resource Strain: Not on file  Food Insecurity: Not on file  Transportation Needs: Not on file  Physical Activity: Not on file  Stress: Not on file  Social Connections: Not on file    Allergies  Allergen Reactions  . Codeine Itching     Current Outpatient Medications:  .  Darunavir-Cobicisctat-Emtricitabine-Tenofovir Alafenamide (SYMTUZA) 800-150-200-10 MG TABS, Take 1 tablet by mouth daily with breakfast., Disp: 30 tablet, Rfl: 5 .  triamcinolone ointment (KENALOG) 0.5 %, Apply 1 application topically 2 (two) times daily as needed., Disp: 30 g, Rfl: 2 .  ondansetron (ZOFRAN ODT) 4 MG disintegrating tablet, Take 1 tablet (4 mg total) by mouth every 8 (eight) hours as needed for nausea or vomiting. (Patient not taking: No sig reported), Disp: 20 tablet, Rfl: 0 .  permethrin (ELIMITE) 5 % cream, Apply all over avoiding eyes/nose/mouth; let dry and wash off after at least 8 hours; repeat in 1-2 weeks if needed (Patient not taking: Reported on  03/11/2020), Disp: 60 g, Rfl: 1   Review of Systems  Constitutional: Negative for chills and fever.  HENT: Negative for congestion and sore throat.   Eyes: Negative for photophobia.  Respiratory: Negative for cough, shortness of breath and wheezing.   Cardiovascular: Negative for chest pain, palpitations and leg swelling.  Gastrointestinal: Negative for abdominal pain, blood in stool, constipation, diarrhea, nausea and vomiting.  Genitourinary: Negative for dysuria, flank pain and hematuria.  Musculoskeletal: Negative for back pain and myalgias.  Skin: Positive for rash.  Neurological: Negative for dizziness, weakness and headaches.  Hematological: Does not bruise/bleed easily.  Psychiatric/Behavioral:  Negative for agitation, confusion, self-injury and suicidal ideas.       Objective:   Physical Exam Constitutional:      General: He is not in acute distress.    Appearance: He is well-developed. He is not diaphoretic.  HENT:     Head: Normocephalic and atraumatic.     Mouth/Throat:     Pharynx: No oropharyngeal exudate.  Eyes:     General: No scleral icterus.    Conjunctiva/sclera: Conjunctivae normal.  Cardiovascular:     Rate and Rhythm: Normal rate and regular rhythm.  Pulmonary:     Effort: Pulmonary effort is normal. No respiratory distress.     Breath sounds: No wheezing.  Abdominal:     General: There is no distension.  Musculoskeletal:     Cervical back: Normal range of motion and neck supple.     Right knee: Normal.     Left knee: No swelling, deformity, effusion, erythema, ecchymosis or lacerations. Normal range of motion. Tenderness present. No medial joint line tenderness. No LCL laxity, MCL laxity or ACL laxity.Normal pulse.     Instability Tests: Anterior drawer test negative. Posterior drawer test negative. Anterior Lachman test negative. Medial McMurray test negative.  Skin:    General: Skin is warm and dry.     Coloration: Skin is not pale.     Findings: No erythema or rash.  Neurological:     General: No focal deficit present.     Mental Status: He is alert and oriented to person, place, and time.     Motor: No abnormal muscle tone.     Coordination: Coordination normal.  Psychiatric:        Mood and Affect: Mood normal.        Behavior: Behavior normal.        Thought Content: Thought content normal.        Judgment: Judgment normal.   Skin Rash several visits ago      04/08/2020:         Assessment & Plan:   Rash: not clear that this is related to his ARV but I will switch him off of DRV based regimen to see if this helps  I want him to get an ACA plan via ICAP and go see Dermatology  HIV disease : well controlled, switch to Delstrigo  and re-check labs in one month  Weight gain: not improved despite change off of INSTI.Diet modification with carb restriction   I spent greater than 40 minutes with the patient including greater than 50% of time in face to face counsel of the patient re ARV regimens rash, nature of each regimen and in coordination of his care.

## 2020-04-23 ENCOUNTER — Encounter: Payer: Self-pay | Admitting: Family Medicine

## 2020-05-05 ENCOUNTER — Other Ambulatory Visit: Payer: Self-pay

## 2020-05-19 ENCOUNTER — Encounter: Payer: Self-pay | Admitting: Infectious Disease

## 2020-05-19 ENCOUNTER — Other Ambulatory Visit: Payer: Self-pay

## 2020-05-19 ENCOUNTER — Telehealth: Payer: Self-pay

## 2020-05-19 ENCOUNTER — Telehealth (INDEPENDENT_AMBULATORY_CARE_PROVIDER_SITE_OTHER): Payer: Self-pay | Admitting: Infectious Disease

## 2020-05-19 DIAGNOSIS — K0889 Other specified disorders of teeth and supporting structures: Secondary | ICD-10-CM

## 2020-05-19 DIAGNOSIS — R21 Rash and other nonspecific skin eruption: Secondary | ICD-10-CM

## 2020-05-19 DIAGNOSIS — B20 Human immunodeficiency virus [HIV] disease: Secondary | ICD-10-CM

## 2020-05-19 DIAGNOSIS — R635 Abnormal weight gain: Secondary | ICD-10-CM

## 2020-05-19 MED ORDER — AMOXICILLIN-POT CLAVULANATE 875-125 MG PO TABS: 1.0000 | ORAL_TABLET | Freq: Two times a day (BID) | ORAL | 1 refills | Status: DC

## 2020-05-19 NOTE — Progress Notes (Signed)
Virtual Visit via Telephone Note  I connected with Jason [name] on 05/19/20 at  4:00 PM EST by telephone and verified that I am speaking with the correct person using two identifiers.  Location: Patient: Home Provider: my Home   I discussed the limitations, risks, security and privacy concerns of performing an evaluation and management service by telephone and the availability of in person appointments. I also discussed with the patient that there may be a patient responsible charge related to this service. The patient expressed understanding and agreed to proceed.  Chief complaint: dental pain  History of Present Illness:  55 year old Caucasian man living with HIV that is been nicely controlled on Symtuza.    He continues to have persistence of rash which he has had my exam of the chart has been going on for years.  He was started on 3 pill regimen in [place] when started his ARVs.  He seems to have been taken off of DRV and switched to [place] to see if it would help with rash abut not clear if it did  He then went back to [facility] and was still complaining of rash and we placed him on Delstrigo.  He has had no problems on Delstrigo though his rash has not improved at all but waxes and wanes.  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  He is suffering mouth pain and swelling in context of several teeth than will need attention. He has already had several worked on.  Past Medical History:  Diagnosis Date  . AIDS (HCC) 01/18/2016  . Bloating symptom 10/11/2017  . Depression 01/18/2016  . Dysphagia 07/19/2016  . Facial rash 03/03/2016  . Hepatitis C   . HIV positive (HCC)   . Inguinal hernia 01/18/2017  . Left elbow pain 08/15/2018  . Malar rash 01/18/2017  . Osteoarthritis of left knee 10/02/2019  . Right knee pain 10/11/2017  .  Scrotal ulcer 12/05/2018  . Thrush 03/03/2016  . Weight gain 05/21/2019  . Weight gain 05/21/2019    Past Surgical History:  Procedure Laterality Date  . ESOPHAGOGASTRODUODENOSCOPY (EGD) WITH PROPOFOL N/A 09/10/2016   Procedure: ESOPHAGOGASTRODUODENOSCOPY (EGD) WITH PROPOFOL;  Surgeon: Vida Rigger, MD;  Location: [facility] ENDOSCOPY;  Service: Endoscopy;  Laterality: N/A;    Family History  Problem Relation Age of Onset  . Kidney disease Father       Social History   Socioeconomic History  . Marital status: Single    Spouse name: Not on file  . Number of children: Not on file  . Years of education: Not on file  . Highest education level: Not on file  Occupational History  . Not on file  Tobacco Use  . Smoking status: Never Smoker  . Smokeless tobacco: Never Used  Substance and Sexual Activity  . Alcohol use: No    Comment: rare  . Drug use: Yes    Types: Marijuana  . Sexual activity: Not Currently    Partners: Male  Other Topics Concern  . Not on file  Social History Narrative  . Not on file   Social Determinants of Health   Financial Resource Strain: Not on file  Food Insecurity: Not on file  Transportation Needs: Not on file  Physical Activity: Not on file  Stress: Not on file  Social Connections:  Not on file    Allergies  Allergen Reactions  . Codeine Itching     Current Outpatient Medications:  .  amoxicillin-clavulanate (AUGMENTIN) 875-125 MG tablet, Take 1 tablet by mouth 2 (two) times daily., Disp: 28 tablet, Rfl: 1 .  doravirin-lamivudin-tenofov df (DELSTRIGO) 100-300-300 MG TABS per tablet, Take 1 tablet by mouth daily., Disp: 30 tablet, Rfl: 11 .  ondansetron (ZOFRAN ODT) 4 MG disintegrating tablet, Take 1 tablet (4 mg total) by mouth every 8 (eight) hours as needed for nausea or vomiting. (Patient not taking: No sig reported), Disp: 20 tablet, Rfl: 0 .  triamcinolone ointment (KENALOG) 0.5 %, Apply 1 application topically 2 (two) times daily as needed.,  Disp: 30 g, Rfl: 2    Observations/Objective:  Jason [name] appeared to be doing relatively well   Assessment and Plan:  HIV disease: will check labs today then he will see me again and likely change to Biktarvy  Rash: may very well be due to detergent and ? Can he avoid this  Dental: I gafve rx for Augmentin x 14 days with a refill  Follow Up Instructions:    I discussed the assessment and treatment plan with the patient. The patient was provided an opportunity to ask questions and all were answered. The patient agreed with the plan and demonstrated an understanding of the instructions.   The patient was advised to call back or seek an in-person evaluation if the symptoms worsen or if the condition fails to improve as anticipated.  I provided 22 minutes of non-face-to-face time during this encounter.   Acey Lav, MD

## 2020-05-19 NOTE — Addendum Note (Signed)
Addended by: Harley Alto on: 05/19/2020 03:56 PM   Modules accepted: Orders

## 2020-05-19 NOTE — Telephone Encounter (Signed)
Called patient to change him to a virtual visit. He states he has been having a rash on the inside of his arms with itching for the last couple of months and was wondering if his medication might need to be changed again. RN advised him to address this concern at his visit with [name] this afternoon. Patient would still like to come in for labs today.   Sandie Ano, RN

## 2020-05-19 NOTE — Telephone Encounter (Signed)
Just need an HIV RNA quant

## 2020-05-20 LAB — T-HELPER CELL (CD4) - (RCID CLINIC ONLY)
CD4 % Helper T Cell: 20 % — ABNORMAL LOW (ref 33–65)
CD4 T Cell Abs: 274 /uL — ABNORMAL LOW (ref 400–1790)

## 2020-05-22 LAB — COMPLETE METABOLIC PANEL WITH GFR
AG Ratio: 1.5 (calc) (ref 1.0–2.5)
ALT: 13 U/L (ref 9–46)
AST: 17 U/L (ref 10–35)
Albumin: 4.3 g/dL (ref 3.6–5.1)
Alkaline phosphatase (APISO): 92 U/L (ref 35–144)
BUN: 12 mg/dL (ref 7–25)
CO2: 25 mmol/L (ref 20–32)
Calcium: 9.6 mg/dL (ref 8.6–10.3)
Chloride: 105 mmol/L (ref 98–110)
Creat: 0.96 mg/dL (ref 0.70–1.33)
GFR, Est African American: 103 mL/min/{1.73_m2} (ref >=60)
GFR, Est Non African American: 89 mL/min/{1.73_m2} (ref >=60)
Globulin: 2.9 g/dL (calc) (ref 1.9–3.7)
Glucose, Bld: 110 mg/dL — ABNORMAL HIGH (ref 65–99)
Potassium: 4.1 mmol/L (ref 3.5–5.3)
Sodium: 137 mmol/L (ref 135–146)
Total Bilirubin: 0.3 mg/dL (ref 0.2–1.2)
Total Protein: 7.2 g/dL (ref 6.1–8.1)

## 2020-05-22 LAB — CBC WITH DIFFERENTIAL/PLATELET
Absolute Monocytes: 663 cells/uL (ref 200–950)
Basophils Absolute: 27 cells/uL (ref 0–200)
Basophils Relative: 0.4 %
Eosinophils Absolute: 201 cells/uL (ref 15–500)
Eosinophils Relative: 3 %
HCT: 42.4 % (ref 38.5–50.0)
Hemoglobin: 14.5 g/dL (ref 13.2–17.1)
Lymphs Abs: 1427 cells/uL (ref 850–3900)
MCH: 31.1 pg (ref 27.0–33.0)
MCHC: 34.2 g/dL (ref 32.0–36.0)
MCV: 91 fL (ref 80.0–100.0)
MPV: 9.6 fL (ref 7.5–12.5)
Monocytes Relative: 9.9 %
Neutro Abs: 4382 cells/uL (ref 1500–7800)
Neutrophils Relative %: 65.4 %
Platelets: 324 10*3/uL (ref 140–400)
RBC: 4.66 10*6/uL (ref 4.20–5.80)
RDW: 12.9 % (ref 11.0–15.0)
Total Lymphocyte: 21.3 %
WBC: 6.7 10*3/uL (ref 3.8–10.8)

## 2020-05-22 LAB — HIV-1 RNA QUANT-NO REFLEX-BLD
HIV 1 RNA Quant: 22 Copies/mL — ABNORMAL HIGH
HIV-1 RNA Quant, Log: 1.35 Log cps/mL — ABNORMAL HIGH

## 2020-05-25 ENCOUNTER — Encounter: Payer: Self-pay | Admitting: Family Medicine

## 2020-06-29 ENCOUNTER — Ambulatory Visit (INDEPENDENT_AMBULATORY_CARE_PROVIDER_SITE_OTHER): Payer: Self-pay | Admitting: Infectious Disease

## 2020-06-29 ENCOUNTER — Encounter: Payer: Self-pay | Admitting: Infectious Disease

## 2020-06-29 ENCOUNTER — Other Ambulatory Visit: Payer: Self-pay

## 2020-06-29 VITALS — BP 126/81 | HR 74 | Resp 16 | Ht 68.0 in | Wt 173.0 lb

## 2020-06-29 DIAGNOSIS — Z8619 Personal history of other infectious and parasitic diseases: Secondary | ICD-10-CM

## 2020-06-29 DIAGNOSIS — B20 Human immunodeficiency virus [HIV] disease: Secondary | ICD-10-CM

## 2020-06-29 DIAGNOSIS — R21 Rash and other nonspecific skin eruption: Secondary | ICD-10-CM

## 2020-06-29 DIAGNOSIS — R635 Abnormal weight gain: Secondary | ICD-10-CM

## 2020-06-29 MED ORDER — BIKTARVY 50-200-25 MG PO TABS: 1.0000 | ORAL_TABLET | Freq: Every day | ORAL | 11 refills | Status: DC

## 2020-06-29 NOTE — Progress Notes (Signed)
Subjective:  Chief complaint continued problem with a rash on his forearm  Patient ID: Jason [name], male    DOB: 1966-02-03, 55 y.o.   MRN: [id number]  HPI  Jason [name] is a 55 year old Caucasian man living with HIV who had prior hepatitis C that has been cured.  He has had troubles with a rash that at one point in time we blamed on SYMTUZA then later potentially on BIKTARVY who is now on Delstrigo.  He also has struggled with weight gain while on antiretrovirals.  Currently is still holding steady at 170 but not able to lose further weight.  His rash continues to be a problem and he believes and is fact certain that it has to do with chemicals he uses while at work because it is isolated to his arms where he is exposed to these chemicals.  He would like to go back onto [facility] after extensive discussions today.  Past Medical History:  Diagnosis Date  . AIDS (HCC) 01/18/2016  . Bloating symptom 10/11/2017  . Depression 01/18/2016  . Dysphagia 07/19/2016  . Facial rash 03/03/2016  . Hepatitis C   . HIV positive (HCC)   . Inguinal hernia 01/18/2017  . Left elbow pain 08/15/2018  . Malar rash 01/18/2017  . Osteoarthritis of left knee 10/02/2019  . Right knee pain 10/11/2017  . Scrotal ulcer 12/05/2018  . Thrush 03/03/2016  . Weight gain 05/21/2019  . Weight gain 05/21/2019    Past Surgical History:  Procedure Laterality Date  . ESOPHAGOGASTRODUODENOSCOPY (EGD) WITH PROPOFOL N/A 09/10/2016   Procedure: ESOPHAGOGASTRODUODENOSCOPY (EGD) WITH PROPOFOL;  Surgeon: Vida Rigger, MD;  Location: [facility] ENDOSCOPY;  Service: Endoscopy;  Laterality: N/A;    Family History  Problem Relation Age of Onset  . Kidney disease Father       Social History   Socioeconomic History  . Marital status: Single    Spouse name: Not on file  . Number of children: Not on file  . Years of education: Not on file  . Highest education level: Not on file  Occupational History  . Not on file  Tobacco Use  . Smoking  status: Never Smoker  . Smokeless tobacco: Never Used  Substance and Sexual Activity  . Alcohol use: No    Comment: rare  . Drug use: Yes    Types: Marijuana  . Sexual activity: Not Currently    Partners: Male  Other Topics Concern  . Not on file  Social History Narrative  . Not on file   Social Determinants of Health   Financial Resource Strain: Not on file  Food Insecurity: Not on file  Transportation Needs: Not on file  Physical Activity: Not on file  Stress: Not on file  Social Connections: Not on file    Allergies  Allergen Reactions  . Codeine Itching     Current Outpatient Medications:  .  doravirin-lamivudin-tenofov df (DELSTRIGO) 100-300-300 MG TABS per tablet, Take 1 tablet by mouth daily., Disp: 30 tablet, Rfl: 11 .  triamcinolone ointment (KENALOG) 0.5 %, Apply 1 application topically 2 (two) times daily as needed., Disp: 30 g, Rfl: 2   Review of Systems  Constitutional: Negative for activity change, appetite change, chills, diaphoresis, fatigue, fever and unexpected weight change.  HENT: Negative for congestion, rhinorrhea, sinus pressure, sneezing, sore throat and trouble swallowing.   Eyes: Negative for photophobia and visual disturbance.  Respiratory: Negative for cough, chest tightness, shortness of breath, wheezing and stridor.   Cardiovascular: Negative for chest pain,  palpitations and leg swelling.  Gastrointestinal: Negative for abdominal distention, abdominal pain, anal bleeding, blood in stool, constipation, diarrhea, nausea and vomiting.  Genitourinary: Negative for difficulty urinating, dysuria, flank pain and hematuria.  Musculoskeletal: Negative for arthralgias, back pain, gait problem, joint swelling and myalgias.  Skin: Positive for rash. Negative for color change, pallor and wound.  Neurological: Negative for dizziness, tremors, weakness and light-headedness.  Hematological: Negative for adenopathy. Does not bruise/bleed easily.   Psychiatric/Behavioral: Negative for agitation, behavioral problems, confusion, decreased concentration, dysphoric mood and sleep disturbance.       Objective:   Physical Exam Constitutional:      General: He is not in acute distress.    Appearance: Normal appearance. He is well-developed. He is not ill-appearing or diaphoretic.  HENT:     Head: Normocephalic and atraumatic.     Right Ear: Hearing and external ear normal.     Left Ear: Hearing and external ear normal.     Nose: No nasal deformity or rhinorrhea.  Eyes:     General: No scleral icterus.    Conjunctiva/sclera: Conjunctivae normal.     Right eye: Right conjunctiva is not injected.     Left eye: Left conjunctiva is not injected.     Pupils: Pupils are equal, round, and reactive to light.  Neck:     Vascular: No JVD.  Cardiovascular:     Rate and Rhythm: Normal rate and regular rhythm.     Heart sounds: S1 normal and S2 normal.  Pulmonary:     Effort: Pulmonary effort is normal. No respiratory distress.  Abdominal:     General: There is no distension.     Palpations: Abdomen is soft.  Musculoskeletal:        General: Normal range of motion.     Right shoulder: Normal.     Left shoulder: Normal.     Cervical back: Normal range of motion and neck supple.     Right hip: Normal.     Left hip: Normal.     Right knee: Normal.     Left knee: Normal.  Lymphadenopathy:     Head:     Right side of head: No submandibular, preauricular or posterior auricular adenopathy.     Left side of head: No submandibular, preauricular or posterior auricular adenopathy.     Cervical: No cervical adenopathy.     Right cervical: No superficial or deep cervical adenopathy.    Left cervical: No superficial or deep cervical adenopathy.  Skin:    General: Skin is warm and dry.     Coloration: Skin is not pale.     Findings: Rash present. No abrasion, bruising, ecchymosis, erythema or lesion.     Nails: There is no clubbing.   Neurological:     Mental Status: He is alert and oriented to person, place, and time.     Sensory: No sensory deficit.     Coordination: Coordination normal.     Gait: Gait normal.  Psychiatric:        Attention and Perception: He is attentive.        Mood and Affect: Mood normal.        Speech: Speech normal.        Behavior: Behavior normal. Behavior is cooperative.        Thought Content: Thought content normal.        Judgment: Judgment normal.     On arms June 29, 2020:  Assessment & Plan:  HIV disease switch to [facility] and check labs in 2 months time.  Rash: Clearly related to chemical at work does not seem to be controllable with topical steroids that I have prescribed.  It could somehow avoid these chemicals that would clearly be the best solution to this situation.  More potent corticosteroids could be prescribed as well.  He is going to seek dermatology referral once he has coverage for that.  Hepatitis C: Cured  Weight gain: Struggled with this with his HIV infection.  Seems to be stable at this point in time.  I spent greater than 40 minutes with the patient including greater than 50% of time in face to face counsel of the patient the nature of various antiretroviral regimens that and the strengths and weaknesses of each and in coordination of his care.

## 2020-07-14 ENCOUNTER — Other Ambulatory Visit (HOSPITAL_COMMUNITY): Payer: Self-pay

## 2020-07-14 MED ORDER — SODIUM FLUORIDE 1.1 % DT CREA
TOPICAL_CREAM | DENTAL | 3 refills | Status: DC
  Filled 2020-07-14: qty 51, 30d supply, fill #0

## 2020-07-21 ENCOUNTER — Other Ambulatory Visit: Payer: Self-pay

## 2020-07-21 ENCOUNTER — Ambulatory Visit: Payer: Self-pay | Attending: Family Medicine

## 2020-09-01 ENCOUNTER — Ambulatory Visit (INDEPENDENT_AMBULATORY_CARE_PROVIDER_SITE_OTHER): Payer: Self-pay | Admitting: Infectious Disease

## 2020-09-01 ENCOUNTER — Encounter: Payer: Self-pay | Admitting: Infectious Disease

## 2020-09-01 VITALS — BP 108/70 | HR 70 | Temp 97.7°F | Wt 175.2 lb

## 2020-09-01 DIAGNOSIS — R21 Rash and other nonspecific skin eruption: Secondary | ICD-10-CM

## 2020-09-01 DIAGNOSIS — B20 Human immunodeficiency virus [HIV] disease: Secondary | ICD-10-CM

## 2020-09-01 DIAGNOSIS — Z8619 Personal history of other infectious and parasitic diseases: Secondary | ICD-10-CM

## 2020-09-01 DIAGNOSIS — R85612 Low grade squamous intraepithelial lesion on cytologic smear of anus (LGSIL): Secondary | ICD-10-CM

## 2020-09-01 DIAGNOSIS — A630 Anogenital (venereal) warts: Secondary | ICD-10-CM

## 2020-09-01 HISTORY — DX: Anogenital (venereal) warts: A63.0

## 2020-09-01 HISTORY — DX: Low grade squamous intraepithelial lesion on cytologic smear of anus (LGSIL): R85.612

## 2020-09-01 NOTE — Progress Notes (Signed)
Subjective:  Chief complaint is to have a rash on his forearms which he believes is related to something at work.  Patient ID: Jason [name], male    DOB: Nov 02, 1965, 55 y.o.   MRN: [id number]  HPI  Jason [name] is a 55 year old Caucasian man living with HIV who had prior hepatitis C that has been cured.  He has had troubles with a rash that at one point in time we blamed on SYMTUZA then later potentially on BIKTARVY whot hen switched to  Delstrigo.  He also has struggled with weight gain while on antiretrovirals.    His rash continues to be a problem and he believes and is fact certain that it has to do with chemicals he uses while at work because it is isolated to his arms where he is exposed to these chemicals.  He wanted to switch back to [facility] which we did at his last visit.  She saw Dr. 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  Courage him to go be seen by him promptly.  States that the triamcinolone cream that I prescribed him has not helped his rash.  He also was not helped by antihistamine therapy.  He would like to be referred to dermatology now that he has a [facility] assistance plan.    Past Medical History:  Diagnosis Date  . AIDS (HCC) 01/18/2016  . 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 Bloating symptom 10/11/2017  . Depression 01/18/2016  . Dysphagia 07/19/2016  . Facial rash 03/03/2016  . Hepatitis C   . HIV positive (HCC)   . Inguinal hernia 01/18/2017  . Left elbow pain 08/15/2018  . Malar rash 01/18/2017  . Osteoarthritis of left knee 10/02/2019  . Right knee pain 10/11/2017  . Scrotal ulcer 12/05/2018  . Thrush 03/03/2016  . Weight gain 05/21/2019  . Weight gain 05/21/2019    Past Surgical History:  Procedure Laterality Date  . ESOPHAGOGASTRODUODENOSCOPY (EGD) WITH PROPOFOL N/A 09/10/2016   Procedure: ESOPHAGOGASTRODUODENOSCOPY (EGD) WITH  PROPOFOL;  Surgeon: Vida Rigger, MD;  Location: [facility] ENDOSCOPY;  Service: Endoscopy;  Laterality: N/A;    Family History  Problem Relation Age of Onset  . Kidney disease Father       Social History   Socioeconomic History  . Marital status: Single    Spouse name: Not on file  . Number of children: Not on file  . Years of education: Not on file  . Highest education level: Not on file  Occupational History  . Not on file  Tobacco Use  . Smoking status: Never Smoker  . Smokeless tobacco: Never Used  Substance and Sexual Activity  . Alcohol use: No    Comment: rare  . Drug use: Yes    Types: Marijuana  . Sexual activity: Not Currently    Partners: Male  Other Topics Concern  . Not on file  Social History Narrative  . Not on file   Social Determinants of Health   Financial Resource Strain: Not on file  Food Insecurity: Not on file  Transportation Needs: Not on file  Physical Activity: Not on file  Stress: Not on file  Social Connections: Not on file    Allergies  Allergen Reactions  . Codeine Itching     Current Outpatient Medications:  .  bictegravir-emtricitabine-tenofovir AF (BIKTARVY) 50-200-25 MG TABS tablet, Take 1 tablet by  mouth daily., Disp: 30 tablet, Rfl: 11 .  sodium fluoride (PREVIDENT 5000 PLUS) 1.1 % CREA dental cream, Brush on teeth with a toothbrush after evening mouth care. Spit out excess and do NOT rinse., Disp: 51 g, Rfl: 3 .  triamcinolone ointment (KENALOG) 0.5 %, Apply 1 application topically 2 (two) times daily as needed., Disp: 30 g, Rfl: 2   Review of Systems  Constitutional: Negative for activity change, appetite change, chills, diaphoresis, fatigue, fever and unexpected weight change.  HENT: Negative for congestion, rhinorrhea, sinus pressure, sneezing, sore throat and trouble swallowing.   Eyes: Negative for photophobia and visual disturbance.  Respiratory: Negative for cough, chest tightness, shortness of breath, wheezing and  stridor.   Cardiovascular: Negative for chest pain, palpitations and leg swelling.  Gastrointestinal: Negative for abdominal distention, abdominal pain, anal bleeding, blood in stool, constipation, diarrhea, nausea and vomiting.  Genitourinary: Negative for difficulty urinating, dysuria, flank pain and hematuria.  Musculoskeletal: Negative for arthralgias, back pain, gait problem, joint swelling and myalgias.  Skin: Positive for rash. Negative for color change, pallor and wound.  Neurological: Negative for dizziness, tremors, weakness and light-headedness.  Hematological: Negative for adenopathy. Does not bruise/bleed easily.  Psychiatric/Behavioral: Negative for agitation, behavioral problems, confusion, decreased concentration, dysphoric mood and sleep disturbance.       Objective:   Physical Exam Constitutional:      General: He is not in acute distress.    Appearance: Normal appearance. He is well-developed. He is not ill-appearing or diaphoretic.  HENT:     Head: Normocephalic and atraumatic.     Right Ear: Hearing and external ear normal.     Left Ear: Hearing and external ear normal.     Nose: No nasal deformity or rhinorrhea.  Eyes:     General: No scleral icterus.    Extraocular Movements: Extraocular movements intact.     Conjunctiva/sclera: Conjunctivae normal.     Right eye: Right conjunctiva is not injected.     Left eye: Left conjunctiva is not injected.  Neck:     Vascular: No JVD.  Cardiovascular:     Rate and Rhythm: Normal rate and regular rhythm.     Heart sounds: S1 normal and S2 normal.  Pulmonary:     Effort: Pulmonary effort is normal. No respiratory distress.  Abdominal:     General: There is no distension.     Palpations: Abdomen is soft.  Musculoskeletal:        General: Normal range of motion.     Right shoulder: Normal.     Left shoulder: Normal.     Cervical back: Normal range of motion and neck supple.     Right hip: Normal.     Left hip:  Normal.     Right knee: Normal.     Left knee: Normal.  Lymphadenopathy:     Head:     Right side of head: No submandibular, preauricular or posterior auricular adenopathy.     Left side of head: No submandibular, preauricular or posterior auricular adenopathy.     Cervical: No cervical adenopathy.     Right cervical: No superficial or deep cervical adenopathy.    Left cervical: No superficial or deep cervical adenopathy.  Skin:    General: Skin is warm and dry.     Coloration: Skin is not pale.     Findings: Rash present. No abrasion, bruising, ecchymosis, erythema or lesion.     Nails: There is no clubbing.  Neurological:  General: No focal deficit present.     Mental Status: He is alert and oriented to person, place, and time.     Sensory: No sensory deficit.     Coordination: Coordination normal.     Gait: Gait normal.  Psychiatric:        Attention and Perception: He is attentive.        Mood and Affect: Mood normal.        Speech: Speech normal.        Behavior: Behavior normal. Behavior is cooperative.        Thought Content: Thought content normal.        Judgment: Judgment normal.     On arms June 29, 2020:           Assessment & Plan:  HIV disease switch to [facility] we will check labs today.  Rash: Referred to dermatology  Hepatitis C: Cured  Weight gain: Struggled with this with his HIV infection.  Seems to be stable at this point in time.  I spent more than 40 minutes with the patient including greater than 50% of time in face to face counseling of the patient personally reviewing radiographs, along with pertinent laboratory microbiological data review of medical records and in coordination of his care.

## 2020-09-02 LAB — URINE CYTOLOGY ANCILLARY ONLY
Chlamydia: NEGATIVE
Comment: NEGATIVE
Comment: NORMAL
Neisseria Gonorrhea: NEGATIVE

## 2020-09-02 LAB — T-HELPER CELL (CD4) - (RCID CLINIC ONLY)
CD4 % Helper T Cell: 18 % — ABNORMAL LOW (ref 33–65)
CD4 T Cell Abs: 195 /uL — ABNORMAL LOW (ref 400–1790)

## 2020-09-03 LAB — RPR TITER: RPR Titer: 1:2 {titer} — ABNORMAL HIGH

## 2020-09-03 LAB — CBC WITH DIFFERENTIAL/PLATELET
Absolute Monocytes: 672 cells/uL (ref 200–950)
Basophils Absolute: 21 cells/uL (ref 0–200)
Basophils Relative: 0.3 %
Eosinophils Absolute: 154 cells/uL (ref 15–500)
Eosinophils Relative: 2.2 %
HCT: 42.8 % (ref 38.5–50.0)
Hemoglobin: 14.7 g/dL (ref 13.2–17.1)
Lymphs Abs: 1057 cells/uL (ref 850–3900)
MCH: 30.8 pg (ref 27.0–33.0)
MCHC: 34.3 g/dL (ref 32.0–36.0)
MCV: 89.7 fL (ref 80.0–100.0)
MPV: 9.8 fL (ref 7.5–12.5)
Monocytes Relative: 9.6 %
Neutro Abs: 5096 cells/uL (ref 1500–7800)
Neutrophils Relative %: 72.8 %
Platelets: 283 10*3/uL (ref 140–400)
RBC: 4.77 10*6/uL (ref 4.20–5.80)
RDW: 12.4 % (ref 11.0–15.0)
Total Lymphocyte: 15.1 %
WBC: 7 10*3/uL (ref 3.8–10.8)

## 2020-09-03 LAB — COMPLETE METABOLIC PANEL WITH GFR
AG Ratio: 1.4 (calc) (ref 1.0–2.5)
ALT: 14 U/L (ref 9–46)
AST: 18 U/L (ref 10–35)
Albumin: 4.2 g/dL (ref 3.6–5.1)
Alkaline phosphatase (APISO): 86 U/L (ref 35–144)
BUN: 15 mg/dL (ref 7–25)
CO2: 24 mmol/L (ref 20–32)
Calcium: 9.3 mg/dL (ref 8.6–10.3)
Chloride: 105 mmol/L (ref 98–110)
Creat: 0.95 mg/dL (ref 0.70–1.33)
GFR, Est African American: 104 mL/min/{1.73_m2} (ref >=60)
GFR, Est Non African American: 90 mL/min/{1.73_m2} (ref >=60)
Globulin: 3.1 g/dL (calc) (ref 1.9–3.7)
Glucose, Bld: 85 mg/dL (ref 65–99)
Potassium: 4.2 mmol/L (ref 3.5–5.3)
Sodium: 138 mmol/L (ref 135–146)
Total Bilirubin: 0.4 mg/dL (ref 0.2–1.2)
Total Protein: 7.3 g/dL (ref 6.1–8.1)

## 2020-09-03 LAB — HIV-1 RNA QUANT-NO REFLEX-BLD
HIV 1 RNA Quant: NOT DETECTED Copies/mL
HIV-1 RNA Quant, Log: NOT DETECTED Log cps/mL

## 2020-09-03 LAB — RPR: RPR Ser Ql: REACTIVE — AB

## 2020-09-03 LAB — FLUORESCENT TREPONEMAL AB(FTA)-IGG-BLD: Fluorescent Treponemal ABS: REACTIVE — AB

## 2020-09-09 ENCOUNTER — Other Ambulatory Visit: Payer: Self-pay | Admitting: Infectious Diseases

## 2020-09-09 DIAGNOSIS — B20 Human immunodeficiency virus [HIV] disease: Secondary | ICD-10-CM

## 2020-09-10 ENCOUNTER — Telehealth: Payer: Self-pay | Admitting: *Deleted

## 2020-09-10 DIAGNOSIS — K0889 Other specified disorders of teeth and supporting structures: Secondary | ICD-10-CM

## 2020-09-10 NOTE — Telephone Encounter (Signed)
Patient called, asked if he could have antibiotics again for his dental infection Last prescribed 2/22. RN asked if had contacted [facility] to ask about an acute dental appointment. He states it won't be for 3+ months. RN offered him an appointment 6/17 8:45, but patient states he can only be seen Mondays and Tuesdays. He does not want to wait that long for an antibiotic, would rather just have this refilled for now. RN called [facility] to let them know about the acute need. Patient was previously scheduled for extraction with dental clinic May 2022, but unsure if this was done. Claris Che will contact patient 09/11/20. Please advise on antibiotic refill. Andree Coss, RN

## 2020-09-11 MED ORDER — AMOXICILLIN-POT CLAVULANATE 875-125 MG PO TABS: 1.0000 | ORAL_TABLET | Freq: Two times a day (BID) | ORAL | 1 refills | Status: AC

## 2020-09-11 NOTE — Telephone Encounter (Signed)
Relayed message to patient. [facility] has 3 dental clinics at [facility] this month, 2 next week. RN encouraged patient to call in for an appointment today, to start the oral augmentin today so that dental may be able to work him in next week if possible. Sent prescription to [facility] at [address] Sent with 1 refill in case Dental is not able to see him next week. Andree Coss, RN

## 2020-09-11 NOTE — Addendum Note (Signed)
Addended by: Andree Coss on: 09/11/2020 09:16 AM   Modules accepted: Orders

## 2020-09-14 NOTE — Telephone Encounter (Signed)
Jeriyah Granlund at [facility] let triage know that she called the patient and offered him an appointment today 09/14/20 at 2:00.  RN called patient to see if he would answer a call from [facility], left message that he needs to return the call by 11:00 today to be seen at 2:00.  Ardyce Heyer's phone is [phone]. Patient can either call [facility] or Marcelino Duster to accept/decline appointment. Andree Coss, RN

## 2020-09-15 ENCOUNTER — Ambulatory Visit: Payer: Self-pay | Admitting: Critical Care Medicine

## 2020-09-23 ENCOUNTER — Other Ambulatory Visit: Payer: Self-pay | Admitting: Family

## 2020-09-23 DIAGNOSIS — B20 Human immunodeficiency virus [HIV] disease: Secondary | ICD-10-CM

## 2020-11-03 ENCOUNTER — Other Ambulatory Visit: Payer: Self-pay

## 2020-11-03 ENCOUNTER — Other Ambulatory Visit: Payer: Self-pay | Admitting: *Deleted

## 2020-11-03 ENCOUNTER — Telehealth (INDEPENDENT_AMBULATORY_CARE_PROVIDER_SITE_OTHER): Payer: Self-pay | Admitting: Infectious Disease

## 2020-11-03 DIAGNOSIS — R85612 Low grade squamous intraepithelial lesion on cytologic smear of anus (LGSIL): Secondary | ICD-10-CM

## 2020-11-03 DIAGNOSIS — B20 Human immunodeficiency virus [HIV] disease: Secondary | ICD-10-CM

## 2020-11-03 DIAGNOSIS — Z8619 Personal history of other infectious and parasitic diseases: Secondary | ICD-10-CM

## 2020-11-03 DIAGNOSIS — R21 Rash and other nonspecific skin eruption: Secondary | ICD-10-CM

## 2020-11-03 NOTE — Progress Notes (Signed)
Virtual Visit via Telephone Note  I connected with Jason [name] on 11/03/20 at 11:00 AM EDT by telephone and verified that I am speaking with the correct person using two identifiers.  Location: Patient: Home Provider: [facility]   I discussed the limitations, risks, security and privacy concerns of performing an evaluation and management service by telephone and the availability of in person appointments. I also discussed with the patient that there may be a patient responsible charge related to this service. The patient expressed understanding and agreed to proceed.   History of Present Illness:  Jason [name] is a 55 year old Caucasian man living with HIV whom I recently switched to [facility] but who has not had followup labs. He could not make his appointment in person so we had a phone visit instead.  His rash is better on arms now that he no longer works at same place as before.  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   Malar rash 01/18/2017   Osteoarthritis of left knee 10/02/2019   Right knee pain 10/11/2017   Scrotal ulcer 12/05/2018   Thrush 03/03/2016   Weight gain 05/21/2019   Weight gain 05/21/2019    Past Surgical History:  Procedure Laterality Date   ESOPHAGOGASTRODUODENOSCOPY (EGD) WITH PROPOFOL N/A 09/10/2016   Procedure: ESOPHAGOGASTRODUODENOSCOPY (EGD) WITH PROPOFOL;  Surgeon: Vida Rigger, MD;  Location: [facility] ENDOSCOPY;  Service: Endoscopy;  Laterality: N/A;    Family History  Problem Relation Age of Onset   Kidney disease Father       Social History   Socioeconomic History   Marital status: Single    Spouse  name: Not on file   Number of children: Not on file   Years of education: Not on file   Highest education level: Not on file  Occupational History   Not on file  Tobacco Use   Smoking status: Never   Smokeless tobacco: Never  Substance and Sexual Activity   Alcohol use: No    Comment: rare   Drug use: Yes    Types: Marijuana   Sexual activity: Not Currently    Partners: Male  Other Topics Concern   Not on file  Social History Narrative   Not on file   Social Determinants of Health   Financial Resource Strain: Not on file  Food Insecurity: Not on file  Transportation Needs: Not on file  Physical Activity: Not on file  Stress: Not on file  Social Connections: Not on file    Allergies  Allergen Reactions   Codeine Itching     Current Outpatient Medications:    bictegravir-emtricitabine-tenofovir AF (BIKTARVY) 50-200-25 MG TABS tablet, Take 1 tablet by mouth daily., Disp: 30 tablet, Rfl: 11   sodium fluoride (PREVIDENT 5000 PLUS) 1.1 % CREA dental cream, Brush on teeth with a toothbrush after evening mouth care. Spit out excess and do NOT rinse., Disp: 51 g, Rfl: 3    ROS as per HPI otherwise 12 point ROS is negative    Observations/Objective: Castulo appeared  to be doing well on the phone.  Assessment and Plan: HIV disease: I have ordered  labs to be done within he sees a viral load on August 17 and scheduled him for a follow-up appoint with me early September.  Rash: Has resolved mostly after he is no longer been working at his prior place of employment reinforcing his idea that there was some type of chemical there that was causing this rash  Risk for monkey box did counsel him that being a man was sex with men he is at risk for this and to certainly exercise caution if he is considering having sexual intercourse with anyone at this point in time.  Hopefully will have the vaccine to give him here in clinic against antibiotics  Follow Up Instructions:    I  discussed the assessment and treatment plan with the patient. The patient was provided an opportunity to ask questions and all were answered. The patient agreed with the plan and demonstrated an understanding of the instructions.   The patient was advised to call back or seek an in-person evaluation if the symptoms worsen or if the condition fails to improve as anticipated.     I provided 22  minutes of non-face-to-face time during this encounter.   Acey Lav, MD

## 2020-11-10 ENCOUNTER — Other Ambulatory Visit: Payer: Self-pay

## 2020-11-10 DIAGNOSIS — B20 Human immunodeficiency virus [HIV] disease: Secondary | ICD-10-CM

## 2020-11-11 ENCOUNTER — Other Ambulatory Visit: Payer: Self-pay

## 2020-11-12 LAB — HIV-1 RNA QUANT-NO REFLEX-BLD
HIV 1 RNA Quant: 26 Copies/mL — ABNORMAL HIGH
HIV-1 RNA Quant, Log: 1.42 Log cps/mL — ABNORMAL HIGH

## 2020-11-24 ENCOUNTER — Encounter (HOSPITAL_BASED_OUTPATIENT_CLINIC_OR_DEPARTMENT_OTHER): Payer: Self-pay

## 2020-11-24 ENCOUNTER — Other Ambulatory Visit: Payer: Self-pay

## 2020-11-24 ENCOUNTER — Emergency Department (HOSPITAL_BASED_OUTPATIENT_CLINIC_OR_DEPARTMENT_OTHER)
Admission: EM | Admit: 2020-11-24 | Discharge: 2020-11-25 | Disposition: A | Discharge status: Home / Self Care | Payer: Self-pay | Attending: Emergency Medicine | Admitting: Emergency Medicine

## 2020-11-24 DIAGNOSIS — Z21 Asymptomatic human immunodeficiency virus [HIV] infection status: Secondary | ICD-10-CM | POA: Insufficient documentation

## 2020-11-24 DIAGNOSIS — Y9389 Activity, other specified: Secondary | ICD-10-CM | POA: Insufficient documentation

## 2020-11-24 DIAGNOSIS — M7022 Olecranon bursitis, left elbow: Secondary | ICD-10-CM | POA: Insufficient documentation

## 2020-11-24 MED ORDER — DOXYCYCLINE HYCLATE 100 MG PO CAPS: 100.0000 mg | ORAL_CAPSULE | Freq: Two times a day (BID) | ORAL | 0 refills | Status: DC

## 2020-11-24 MED ORDER — BACITRACIN ZINC 500 UNIT/GM EX OINT
TOPICAL_OINTMENT | Freq: Two times a day (BID) | CUTANEOUS | Status: DC
  Filled 2020-11-24: qty 28.35

## 2020-11-24 MED ORDER — DICLOFENAC SODIUM ER 100 MG PO TB24: 100.0000 mg | ORAL_TABLET | Freq: Every day | ORAL | 0 refills | Status: DC

## 2020-11-24 MED ORDER — CEPHALEXIN 250 MG PO CAPS
500.0000 mg | ORAL_CAPSULE | Freq: Once | ORAL | Status: AC
  Administered 2020-11-25: 500 mg via ORAL
  Filled 2020-11-24: qty 2

## 2020-11-24 MED ORDER — CEPHALEXIN 500 MG PO CAPS: 500.0000 mg | ORAL_CAPSULE | Freq: Four times a day (QID) | ORAL | 0 refills | Status: DC

## 2020-11-24 MED ORDER — DOXYCYCLINE HYCLATE 100 MG PO TABS
100.0000 mg | ORAL_TABLET | Freq: Once | ORAL | Status: AC
  Administered 2020-11-25: 100 mg via ORAL
  Filled 2020-11-24: qty 1

## 2020-11-24 MED ORDER — BACITRACIN ZINC 500 UNIT/GM EX OINT: 1.0000 "application " | TOPICAL_OINTMENT | Freq: Two times a day (BID) | CUTANEOUS | 0 refills | Status: DC

## 2020-11-24 NOTE — ED Triage Notes (Signed)
Pt c/o redness/swelling to left UE/elbow area-started as itching 8/28 with ?insect bite-NAD-steady gait

## 2020-11-24 NOTE — ED Provider Notes (Addendum)
[facility] EMERGENCY DEPARTMENT Provider Note   CSN: [id number] Arrival date & time: 11/24/20  2148     History Chief Complaint  Patient presents with  . Arm Pain    Jason [name] is a 55 y.o. male.  The history is provided by the patient.  Arm Pain This is a new problem. The current episode started 2 days ago. The problem occurs constantly. The problem has been gradually worsening. Pertinent negatives include no chest pain and no abdominal pain. Nothing aggravates the symptoms. Nothing relieves the symptoms. He has tried nothing for the symptoms. The treatment provided no relief.  Patient with HIV presents with L elbow pain since 11/22/20.  Swelling and pain about the L olecranon.  States there was a head at one time.      Past Medical History:  Diagnosis Date  . AIDS (HCC) 01/18/2016  . 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 Bloating symptom 10/11/2017  . Depression 01/18/2016  . Dysphagia 07/19/2016  . Facial rash 03/03/2016  . Hepatitis C   . HIV positive (HCC)   . Inguinal hernia 01/18/2017  . Left elbow pain 08/15/2018  . Low grade squamous intraepithelial lesion on cytologic smear of anus (LGSIL) 09/01/2020  . Malar rash 01/18/2017  . Osteoarthritis of left knee 10/02/2019  . Right knee pain 10/11/2017  . Scrotal ulcer 12/05/2018  . Thrush 03/03/2016  . Weight gain 05/21/2019  . Weight gain 05/21/2019    Patient Active Problem List   Diagnosis Date Noted  . 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 Low grade squamous intraepithelial lesion on cytologic smear of anus (LGSIL) 09/01/2020  . Osteoarthritis of left knee 10/02/2019  . Weight gain 05/21/2019  . Scrotal ulcer 12/05/2018  . Left elbow pain 08/15/2018  . Pain, dental 07/11/2018  . Onycholysis 05/15/2018  . Finger deformity, left 05/15/2018  . Right knee pain 10/11/2017  . Bloating symptom 10/11/2017  . Inguinal hernia 01/18/2017  . Food impaction of  esophagus 09/10/2016  . Dysphagia 07/19/2016  . Rash 03/03/2016  . AIDS (HCC) 01/18/2016  . Depression 01/18/2016  . 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 Hepatitis C virus infection cured after antiviral drug therapy 05/01/2014  . Human immunodeficiency virus (HIV) disease (HCC) 12/09/2013    Past Surgical History:  Procedure Laterality Date  . ESOPHAGOGASTRODUODENOSCOPY (EGD) WITH PROPOFOL N/A 09/10/2016   Procedure: ESOPHAGOGASTRODUODENOSCOPY (EGD) WITH PROPOFOL;  Surgeon: Vida Rigger, MD;  Location: [facility] ENDOSCOPY;  Service: Endoscopy;  Laterality: N/A;       Family History  Problem Relation Age of Onset  . Kidney disease Father     Social History   Tobacco Use  . Smoking status: Never  . Smokeless tobacco: Never  Vaping Use  . Vaping Use: Never used  Substance Use Topics  . Alcohol use: Yes    Comment: rare  . Drug use: Yes    Types: Marijuana    Home Medications Prior to Admission medications   Medication Sig Start Date End Date Taking? Authorizing Provider  bictegravir-emtricitabine-tenofovir AF (BIKTARVY) 50-200-25 MG TABS tablet Take 1 tablet by mouth daily. 06/29/20   Randall Hiss, MD  sodium fluoride (PREVIDENT 5000 PLUS) 1.1 % CREA dental cream Brush on teeth with a toothbrush after evening mouth care. Spit out excess and do NOT rinse. 07/14/20   Joanna Hews, DMD    Allergies    Codeine  Review of  Systems   Review of Systems  Constitutional:  Negative for diaphoresis and fatigue.  HENT:  Negative for facial swelling.   Eyes:  Negative for redness.  Respiratory:  Negative for wheezing.   Cardiovascular:  Negative for chest pain and leg swelling.  Gastrointestinal:  Negative for abdominal pain.  Genitourinary:  Negative for difficulty urinating.  Musculoskeletal:  Positive for arthralgias.  Neurological:  Negative for dizziness.  Psychiatric/Behavioral:  Negative for agitation.   All  other systems reviewed and are negative.  Physical Exam Updated Vital Signs BP 122/72 (BP Location: Right Arm)   Pulse 74   Temp 98.2 F (36.8 C) (Oral)   Resp 18   Ht 5\' 8"  (1.727 m)   Wt 82.6 kg   SpO2 99%   BMI 27.67 kg/m   Physical Exam Vitals and nursing note reviewed.  Constitutional:      General: He is not in acute distress.    Appearance: Normal appearance.  HENT:     Head: Normocephalic and atraumatic.     Nose: Nose normal.  Eyes:     Conjunctiva/sclera: Conjunctivae normal.     Pupils: Pupils are equal, round, and reactive to light.  Cardiovascular:     Rate and Rhythm: Normal rate and regular rhythm.     Pulses: Normal pulses.     Heart sounds: Normal heart sounds.  Pulmonary:     Effort: Pulmonary effort is normal.     Breath sounds: Normal breath sounds.  Abdominal:     General: Abdomen is flat. Bowel sounds are normal.     Palpations: Abdomen is soft.     Tenderness: There is no abdominal tenderness. There is no guarding.  Musculoskeletal:        General: Normal range of motion.     Cervical back: Normal range of motion and neck supple.     Right lower leg: No edema.     Left lower leg: No edema.  Skin:    General: Skin is warm and dry.     Capillary Refill: Capillary refill takes less than 2 seconds.       Neurological:     General: No focal deficit present.     Mental Status: He is alert and oriented to person, place, and time.     Deep Tendon Reflexes: Reflexes normal.  Psychiatric:        Mood and Affect: Mood normal.        Behavior: Behavior normal.    ED Results / Procedures / Treatments   Labs (all labs ordered are listed, but only abnormal results are displayed) Labs Reviewed - No data to display  EKG None  Radiology No results found.  Procedures Procedures   Medications Ordered in ED Medications  bacitracin ointment (has no administration in time range)  cephALEXin (KEFLEX) capsule 500 mg (has no administration in time  range)  doxycycline (VIBRA-TABS) tablet 100 mg (has no administration in time range)    ED Course  I have reviewed the triage vital signs and the nursing notes.  Pertinent labs & imaging results that were available during my care of the patient were reviewed by me and considered in my medical decision making (see chart for details).   Patient has refused inpatient care.  Take all antibiotics.  Follow up with orthopedics.  Strict return precautions given for fever, weakness, worsening pain and streaking up the arm.  Patient verbalizes understanding and agrees to follow up   was evaluated in  Emergency Department on 11/24/2020 for the symptoms described in the history of present illness. He was evaluated in the context of the global COVID-19 pandemic, which necessitated consideration that the patient might be at risk for infection with the SARS-CoV-2 virus that causes COVID-19. Institutional protocols and algorithms that pertain to the evaluation of patients at risk for COVID-19 are in a state of rapid change based on information released by regulatory bodies including the CDC and federal and state organizations. These policies and algorithms were followed during the patient's care in the ED.  Final Clinical Impression(s) / ED Diagnoses Final diagnoses:  None   Return for intractable cough, coughing up blood, fevers > 100.4 unrelieved by medication, shortness of breath, intractable vomiting, chest pain, shortness of breath, weakness, numbness, changes in speech, facial asymmetry, abdominal pain, passing out, Inability to tolerate liquids or food, cough, altered mental status or any concerns. No signs of systemic illness or infection. The patient is nontoxic-appearing on exam and vital signs are within normal limits. I have reviewed the triage vital signs and the nursing notes. Pertinent labs & imaging results that were available during my care of the patient were reviewed by me and  considered in my medical decision making (see chart for details). After history, exam, and medical workup I feel the patient has been appropriately medically screened and is safe for discharge home. Pertinent diagnoses were discussed with the patient. Patient was given return precautions. Rx / DC Orders ED Discharge Orders     None        Tikia Skilton, MD 11/24/20 2348    Emmalina Espericueta, MD 11/24/20 2350

## 2020-11-27 ENCOUNTER — Encounter: Payer: Self-pay | Admitting: Infectious Disease

## 2020-11-27 ENCOUNTER — Other Ambulatory Visit: Payer: Self-pay

## 2020-11-27 ENCOUNTER — Inpatient Hospital Stay (HOSPITAL_COMMUNITY): Admission: AD | Admit: 2020-11-27 | Payer: Self-pay | Source: Ambulatory Visit | Admitting: Family Medicine

## 2020-11-27 ENCOUNTER — Telehealth: Payer: Self-pay

## 2020-11-27 ENCOUNTER — Ambulatory Visit (INDEPENDENT_AMBULATORY_CARE_PROVIDER_SITE_OTHER): Payer: Self-pay | Admitting: Infectious Disease

## 2020-11-27 VITALS — BP 127/84 | HR 77 | Temp 97.8°F | Wt 181.0 lb

## 2020-11-27 DIAGNOSIS — A630 Anogenital (venereal) warts: Secondary | ICD-10-CM

## 2020-11-27 DIAGNOSIS — B20 Human immunodeficiency virus [HIV] disease: Secondary | ICD-10-CM

## 2020-11-27 DIAGNOSIS — M71122 Other infective bursitis, left elbow: Secondary | ICD-10-CM

## 2020-11-27 DIAGNOSIS — R85612 Low grade squamous intraepithelial lesion on cytologic smear of anus (LGSIL): Secondary | ICD-10-CM

## 2020-11-27 DIAGNOSIS — R768 Other specified abnormal immunological findings in serum: Secondary | ICD-10-CM

## 2020-11-27 HISTORY — DX: Other infective bursitis, left elbow: M71.122

## 2020-11-27 NOTE — Telephone Encounter (Signed)
Spoke to Bed Control at [facility]. Per Dr.Van Dam patient needs to be admitted to Med/Surg due to septic bursitis in left arm. Admitted MD: Georgina Snell. Patient aware he will be contacted later today by bed control at [facility] when a bed is ready. Patient verbalized his understanding.   Bonnell Placzek Lesli Albee, CMA

## 2020-11-27 NOTE — Progress Notes (Signed)
Subjective:  Chief complaint acute onset of left elbow pain redness fevers chills malaise   Patient ID: Jason [name], male    DOB: June 25, 1965, 55 y.o.   MRN: [id number]  HPI  Jason [name] is a 55 year old Caucasian man who lives with HIV that is been perfectly controlled on Biktarvy.  He tells me that on 29 August he had acute onset of erythema in his left elbow with pain and malaise.  He had some drainage from the elbow as well.  He was seen in the ER on [id number].  He was diagnosed clinically with septic olecranon bursitis and prescribed doxycycline and Keflex.  Has been taking both antibiotics but despite this he has had increased pain and erythema in his left elbow.  Was asked to make an appoint with Dr. Ophelia Charter as soon as possible but he did not understand this.  He also has no insurance so getting a prompt appointment and evaluation is not trivial.  Regardless he is not doing well despite taking antibiotics.  I do not think that he should be prescribed oral antibiotics to treat a septic bursitis especially if this is due to an MRSA infection.  He needs surgical attention to his olecranon bursa and then targeted therapy based on cultures.  If as Staphylococcus aureus I prefer to treat such patients with parenteral antibiotics.   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   Malar rash 01/18/2017   Osteoarthritis of left knee 10/02/2019   Right knee pain 10/11/2017   Scrotal ulcer 12/05/2018   Thrush 03/03/2016   Weight gain 05/21/2019   Weight gain 05/21/2019    Past Surgical History:  Procedure Laterality Date    ESOPHAGOGASTRODUODENOSCOPY (EGD) WITH PROPOFOL N/A 09/10/2016   Procedure: ESOPHAGOGASTRODUODENOSCOPY (EGD) WITH PROPOFOL;  Surgeon: Vida Rigger, MD;  Location: [facility] ENDOSCOPY;  Service: Endoscopy;  Laterality: N/A;    Family History  Problem Relation Age of Onset   Kidney disease Father       Social History   Socioeconomic History   Marital status: Single    Spouse name: Not on file   Number of children: Not on file   Years of education: Not on file   Highest education level: Not on file  Occupational History   Not on file  Tobacco Use   Smoking status: Never   Smokeless tobacco: Never  Vaping Use   Vaping Use: Never used  Substance and Sexual Activity   Alcohol use: Yes    Comment: rare   Drug use: Yes    Types: Marijuana   Sexual activity: Not Currently    Partners: Male  Other Topics Concern   Not on file  Social History Narrative   Not on file   Social Determinants of Health   Financial Resource Strain: Not on file  Food Insecurity: Not on file  Transportation Needs: Not on file  Physical Activity: Not on file  Stress: Not on file  Social Connections: Not on file    Allergies  Allergen Reactions   Codeine Itching     Current Outpatient Medications:    bictegravir-emtricitabine-tenofovir AF (BIKTARVY)  50-200-25 MG TABS tablet, Take 1 tablet by mouth daily., Disp: 30 tablet, Rfl: 11   cephALEXin (KEFLEX) 500 MG capsule, Take 1 capsule (500 mg total) by mouth 4 (four) times daily., Disp: 28 capsule, Rfl: 0   doxycycline (VIBRAMYCIN) 100 MG capsule, Take 1 capsule (100 mg total) by mouth 2 (two) times daily. One po bid x 7 days, Disp: 14 capsule, Rfl: 0   bacitracin ointment, Apply 1 application topically 2 (two) times daily. (Patient not taking: Reported on 11/27/2020), Disp: 120 g, Rfl: 0   Diclofenac Sodium CR 100 MG 24 hr tablet, Take 1 tablet (100 mg total) by mouth daily. (Patient not taking: Reported on 11/27/2020), Disp: 10 tablet, Rfl: 0   sodium fluoride  (PREVIDENT 5000 PLUS) 1.1 % CREA dental cream, Brush on teeth with a toothbrush after evening mouth care. Spit out excess and do NOT rinse. (Patient not taking: Reported on 11/27/2020), Disp: 51 g, Rfl: 3     Review of Systems  Constitutional:  Positive for diaphoresis, fatigue and fever. Negative for activity change, appetite change, chills and unexpected weight change.  HENT:  Negative for congestion, rhinorrhea, sinus pressure, sneezing, sore throat and trouble swallowing.   Eyes:  Negative for photophobia and visual disturbance.  Respiratory:  Negative for cough, chest tightness, shortness of breath, wheezing and stridor.   Cardiovascular:  Negative for chest pain, palpitations and leg swelling.  Gastrointestinal:  Positive for nausea. Negative for abdominal distention, abdominal pain, anal bleeding, blood in stool, constipation, diarrhea and vomiting.  Genitourinary:  Negative for difficulty urinating, dysuria, flank pain and hematuria.  Musculoskeletal:  Negative for arthralgias, back pain, gait problem, joint swelling and myalgias.  Skin:  Negative for color change, pallor, rash and wound.  Neurological:  Negative for dizziness, tremors, weakness and light-headedness.  Hematological:  Negative for adenopathy. Does not bruise/bleed easily.  Psychiatric/Behavioral:  Negative for agitation, behavioral problems, confusion, decreased concentration, dysphoric mood and sleep disturbance. The patient is nervous/anxious.       Objective:   Physical Exam Constitutional:      Appearance: He is well-developed.  HENT:     Head: Normocephalic and atraumatic.  Eyes:     Conjunctiva/sclera: Conjunctivae normal.  Cardiovascular:     Rate and Rhythm: Normal rate and regular rhythm.  Pulmonary:     Effort: Pulmonary effort is normal. No respiratory distress.     Breath sounds: No wheezing.  Abdominal:     General: There is no distension.     Palpations: Abdomen is soft.  Musculoskeletal:      Left elbow: Swelling and effusion present. Decreased range of motion. Tenderness present.     Cervical back: Normal range of motion and neck supple.  Skin:    General: Skin is warm and dry.     Coloration: Skin is not pale.     Findings: No erythema or rash.  Neurological:     General: No focal deficit present.     Mental Status: He is alert and oriented to person, place, and time.  Psychiatric:        Mood and Affect: Mood normal.        Behavior: Behavior normal.        Thought Content: Thought content normal.        Judgment: Judgment normal.    Elbow with erythema effusion and swelling November 27, 2020:        Assessment & Plan:   Olecranon septic bursitis left elbow.  He needs  to be admitted to the hospital for evaluation by orthopedic surgery and I&D of his bursa with culture sent for aerobic and anaerobic culture.  We can then provide targeted therapy.  I have called Dr. Artis Flock with [facility] who has graciously agreed to have patient admitted to the hospitalist service.  We will  provide formal consultation in the hospital    If he has a Staphylococcus infection my preference is to use IV therapy provided there is no contraindication.  He does have a history of hepatitis C which was cured.  I do not believe he has any active IV drug use he does admit to using marijuana.  HIV disease: This is been perfectly controlled as evidenced by his viral load of 26 copies which is really a blip which was checked on November 10, 2020  His last CD4 count was 195 checked in September 01, 2020 with a percent was 18% and he is generally hovered between above 200 and just below 200.  I do not think he needs PCP prevention  I will continue his BIKTARVY prescription  Hepatitis C without hepatic coma,: Was cured review of his research showed that he had F3 stage.  Rash related to prior job: resolved  HPV infection: needs screening for anal cancer with HRA

## 2021-02-04 ENCOUNTER — Telehealth: Payer: Self-pay

## 2021-02-04 NOTE — Telephone Encounter (Signed)
Called patient to follow up on last office visit regarding left elbow concerns. Pt states that elbow is healed and feels a lot better since last visit. Scheduled appt for him to follow up with MD and to have updated labs. Debating on flu shot.  Pt is active with [facility]. Will update them. Juanita Laster, RMA

## 2021-02-23 ENCOUNTER — Other Ambulatory Visit: Payer: Self-pay

## 2021-02-23 DIAGNOSIS — B20 Human immunodeficiency virus [HIV] disease: Secondary | ICD-10-CM

## 2021-02-23 DIAGNOSIS — Z113 Encounter for screening for infections with a predominantly sexual mode of transmission: Secondary | ICD-10-CM

## 2021-02-23 DIAGNOSIS — Z79899 Other long term (current) drug therapy: Secondary | ICD-10-CM

## 2021-02-24 LAB — T-HELPER CELL (CD4) - (RCID CLINIC ONLY)
CD4 % Helper T Cell: 21 % — ABNORMAL LOW (ref 33–65)
CD4 T Cell Abs: 297 /uL — ABNORMAL LOW (ref 400–1790)

## 2021-02-25 LAB — CBC WITH DIFFERENTIAL/PLATELET
Absolute Monocytes: 761 cells/uL (ref 200–950)
Basophils Absolute: 18 cells/uL (ref 0–200)
Basophils Relative: 0.3 %
Eosinophils Absolute: 159 cells/uL (ref 15–500)
Eosinophils Relative: 2.7 %
HCT: 44.7 % (ref 38.5–50.0)
Hemoglobin: 15.2 g/dL (ref 13.2–17.1)
Lymphs Abs: 1469 cells/uL (ref 850–3900)
MCH: 30.8 pg (ref 27.0–33.0)
MCHC: 34 g/dL (ref 32.0–36.0)
MCV: 90.5 fL (ref 80.0–100.0)
MPV: 10 fL (ref 7.5–12.5)
Monocytes Relative: 12.9 %
Neutro Abs: 3493 cells/uL (ref 1500–7800)
Neutrophils Relative %: 59.2 %
Platelets: 315 10*3/uL (ref 140–400)
RBC: 4.94 10*6/uL (ref 4.20–5.80)
RDW: 12.8 % (ref 11.0–15.0)
Total Lymphocyte: 24.9 %
WBC: 5.9 10*3/uL (ref 3.8–10.8)

## 2021-02-25 LAB — COMPLETE METABOLIC PANEL WITH GFR
AG Ratio: 1.3 (calc) (ref 1.0–2.5)
ALT: 16 U/L (ref 9–46)
AST: 17 U/L (ref 10–35)
Albumin: 4.2 g/dL (ref 3.6–5.1)
Alkaline phosphatase (APISO): 84 U/L (ref 35–144)
BUN: 16 mg/dL (ref 7–25)
CO2: 23 mmol/L (ref 20–32)
Calcium: 9.3 mg/dL (ref 8.6–10.3)
Chloride: 107 mmol/L (ref 98–110)
Creat: 0.88 mg/dL (ref 0.70–1.30)
Globulin: 3.2 g/dL (calc) (ref 1.9–3.7)
Glucose, Bld: 107 mg/dL — ABNORMAL HIGH (ref 65–99)
Potassium: 4 mmol/L (ref 3.5–5.3)
Sodium: 139 mmol/L (ref 135–146)
Total Bilirubin: 0.4 mg/dL (ref 0.2–1.2)
Total Protein: 7.4 g/dL (ref 6.1–8.1)
eGFR: 101 mL/min/{1.73_m2} (ref >=60)

## 2021-02-25 LAB — HIV-1 RNA QUANT-NO REFLEX-BLD
HIV 1 RNA Quant: NOT DETECTED Copies/mL
HIV-1 RNA Quant, Log: NOT DETECTED Log cps/mL

## 2021-02-25 LAB — LIPID PANEL
Cholesterol: 193 mg/dL (ref <200)
HDL: 38 mg/dL — ABNORMAL LOW (ref >=40)
LDL Cholesterol (Calc): 119 mg/dL (calc) — ABNORMAL HIGH
Non-HDL Cholesterol (Calc): 155 mg/dL (calc) — ABNORMAL HIGH (ref <130)
Total CHOL/HDL Ratio: 5.1 (calc) — ABNORMAL HIGH (ref <5.0)
Triglycerides: 236 mg/dL — ABNORMAL HIGH (ref <150)

## 2021-02-25 LAB — FLUORESCENT TREPONEMAL AB(FTA)-IGG-BLD: Fluorescent Treponemal ABS: REACTIVE — AB

## 2021-02-25 LAB — RPR: RPR Ser Ql: REACTIVE — AB

## 2021-02-25 LAB — RPR TITER: RPR Titer: 1:2 {titer} — ABNORMAL HIGH

## 2021-02-26 ENCOUNTER — Encounter: Payer: Self-pay | Admitting: Infectious Disease

## 2021-03-02 ENCOUNTER — Encounter: Payer: Self-pay | Admitting: Infectious Disease

## 2021-03-02 ENCOUNTER — Ambulatory Visit (INDEPENDENT_AMBULATORY_CARE_PROVIDER_SITE_OTHER): Payer: Self-pay | Admitting: Infectious Disease

## 2021-03-02 ENCOUNTER — Other Ambulatory Visit: Payer: Self-pay

## 2021-03-02 VITALS — BP 144/78 | HR 62 | Temp 97.8°F | Wt 185.6 lb

## 2021-03-02 DIAGNOSIS — M25522 Pain in left elbow: Secondary | ICD-10-CM

## 2021-03-02 DIAGNOSIS — M7022 Olecranon bursitis, left elbow: Secondary | ICD-10-CM

## 2021-03-02 DIAGNOSIS — M7020 Olecranon bursitis, unspecified elbow: Secondary | ICD-10-CM

## 2021-03-02 DIAGNOSIS — B20 Human immunodeficiency virus [HIV] disease: Secondary | ICD-10-CM

## 2021-03-02 DIAGNOSIS — R635 Abnormal weight gain: Secondary | ICD-10-CM

## 2021-03-02 HISTORY — DX: Olecranon bursitis, unspecified elbow: M70.20

## 2021-03-02 MED ORDER — DORAVIRINE 100 MG PO TABS: 100.0000 mg | ORAL_TABLET | Freq: Every day | ORAL | 11 refills | Status: DC

## 2021-03-02 MED ORDER — EMTRICITABINE-TENOFOVIR AF 200-25 MG PO TABS: 1.0000 | ORAL_TABLET | Freq: Every day | ORAL | 11 refills | Status: DC

## 2021-03-02 NOTE — Progress Notes (Signed)
Subjective:  Chief complaint : Weight gain on BIKTARVY   Patient ID: Jason [name], male    DOB: 20-Feb-1966, 55 y.o.   MRN: [id number]  HPI  Jason [name] is a 55 year old Caucasian man who lives with HIV that is been perfectly controlled on Biktarvy.  He told me that on 29 August he had acute onset of erythema in his left elbow with pain and malaise.  He had some drainage from the elbow as well.  He was seen in the ER on [id number].  He was diagnosed clinically with septic olecranon bursitis and prescribed doxycycline and Keflex.  Has been taking both antibiotics but despite this he  had increased pain and erythema in his left elbow.  Was asked to make an appoint with Dr. Ophelia Charter as soon as possible but he did not understand this.  He also had no insurance so getting a prompt appointment and evaluation is not trivial.  He had not been doing well initially on oral antibiotics I tried to admit him to the hospital directly for evaluation by surgery and parenteral antibiotics but this never happened.  Currently improved on oral therapy with resolution of his erythema and pain and swelling and systemic symptoms.  He is concerned about having gained at least 15 pounds while back on an integrase strand transfer inhibitor namely BIKTARVY.     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   Malar rash 01/18/2017   Osteoarthritis of left knee 10/02/2019   Right knee pain 10/11/2017   Scrotal ulcer 12/05/2018   Septic olecranon bursitis, left 11/27/2020   Thrush 03/03/2016   Weight gain 05/21/2019   Weight gain 05/21/2019    Past Surgical History:   Procedure Laterality Date   ESOPHAGOGASTRODUODENOSCOPY (EGD) WITH PROPOFOL N/A 09/10/2016   Procedure: ESOPHAGOGASTRODUODENOSCOPY (EGD) WITH PROPOFOL;  Surgeon: Vida Rigger, MD;  Location: [facility] ENDOSCOPY;  Service: Endoscopy;  Laterality: N/A;    Family History  Problem Relation Age of Onset   Kidney disease Father       Social History   Socioeconomic History   Marital status: Single    Spouse name: Not on file   Number of children: Not on file   Years of education: Not on file   Highest education level: Not on file  Occupational History   Not on file  Tobacco Use   Smoking status: Never   Smokeless tobacco: Never  Vaping Use   Vaping Use: Never used  Substance and Sexual Activity   Alcohol use: Yes    Comment: rare   Drug use: Yes    Types: Marijuana   Sexual activity: Not Currently    Partners: Male  Other Topics Concern   Not on file  Social History Narrative   Not on file   Social Determinants of Health   Financial Resource Strain: Not on file  Food Insecurity: Not on file  Transportation Needs: Not on file  Physical Activity: Not on file  Stress: Not on file  Social Connections: Not on file    Allergies  Allergen Reactions   Codeine Itching     Current  Outpatient Medications:    doravirine (PIFELTRO) 100 MG TABS tablet, Take 1 tablet (100 mg total) by mouth daily., Disp: 30 tablet, Rfl: 11   emtricitabine-tenofovir AF (DESCOVY) 200-25 MG tablet, Take 1 tablet by mouth daily., Disp: 30 tablet, Rfl: 11   bacitracin ointment, Apply 1 application topically 2 (two) times daily. (Patient not taking: Reported on 11/27/2020), Disp: 120 g, Rfl: 0   cephALEXin (KEFLEX) 500 MG capsule, Take 1 capsule (500 mg total) by mouth 4 (four) times daily. (Patient not taking: Reported on 03/02/2021), Disp: 28 capsule, Rfl: 0   Diclofenac Sodium CR 100 MG 24 hr tablet, Take 1 tablet (100 mg total) by mouth daily. (Patient not taking: Reported on 11/27/2020), Disp: 10 tablet, Rfl:  0   doxycycline (VIBRAMYCIN) 100 MG capsule, Take 1 capsule (100 mg total) by mouth 2 (two) times daily. One po bid x 7 days (Patient not taking: Reported on 03/02/2021), Disp: 14 capsule, Rfl: 0   sodium fluoride (PREVIDENT 5000 PLUS) 1.1 % CREA dental cream, Brush on teeth with a toothbrush after evening mouth care. Spit out excess and do NOT rinse. (Patient not taking: Reported on 11/27/2020), Disp: 51 g, Rfl: 3     Review of Systems  Constitutional:  Positive for unexpected weight change. Negative for activity change, appetite change, chills, diaphoresis, fatigue and fever.  HENT:  Negative for congestion, rhinorrhea, sinus pressure, sneezing, sore throat and trouble swallowing.   Eyes:  Negative for photophobia and visual disturbance.  Respiratory:  Negative for cough, chest tightness, shortness of breath, wheezing and stridor.   Cardiovascular:  Negative for chest pain, palpitations and leg swelling.  Gastrointestinal:  Negative for abdominal distention, abdominal pain, anal bleeding, blood in stool, constipation, diarrhea, nausea and vomiting.  Genitourinary:  Negative for difficulty urinating, dysuria, flank pain and hematuria.  Musculoskeletal:  Negative for arthralgias, back pain, gait problem, joint swelling and myalgias.  Skin:  Negative for color change, pallor, rash and wound.  Neurological:  Negative for dizziness, tremors, weakness and light-headedness.  Hematological:  Negative for adenopathy. Does not bruise/bleed easily.  Psychiatric/Behavioral:  Negative for agitation, behavioral problems, confusion, decreased concentration, dysphoric mood and sleep disturbance.       Objective:   Physical Exam Constitutional:      Appearance: He is well-developed.  HENT:     Head: Normocephalic and atraumatic.  Eyes:     Conjunctiva/sclera: Conjunctivae normal.  Cardiovascular:     Rate and Rhythm: Normal rate and regular rhythm.  Pulmonary:     Effort: Pulmonary effort is normal.  No respiratory distress.     Breath sounds: No wheezing.  Abdominal:     General: There is no distension.     Palpations: Abdomen is soft.  Musculoskeletal:        General: No tenderness. Normal range of motion.     Cervical back: Normal range of motion and neck supple.  Skin:    General: Skin is warm and dry.     Coloration: Skin is not pale.     Findings: No erythema or rash.  Neurological:     General: No focal deficit present.     Mental Status: He is alert and oriented to person, place, and time.  Psychiatric:        Mood and Affect: Mood normal.        Behavior: Behavior normal.        Thought Content: Thought content normal.        Judgment: Judgment normal.  Elbow with erythema effusion and swelling November 27, 2020:    Left elbow today March 02, 2021:       Assessment & Plan:    Weight gain: Certainly some of this could be associated with being back on a integrase strand transfer inhibitor tenofovir alafenamide.  Is not eligible for the ACT G study 5391 which I discussed with him.  Ultimately decided to change him to [facility] and DESCOVY.  We will check her labs again in 1 month's time and I will see him in early February.  Have also recommended that he try to restrict carbohydrates through carbohydrate restricted diet and increase exercise.    HIV disease: Reviewed his viral load which is now detected on February 23, 2021 and his CD4 count which was 297.    Lab Results  Component Value Date   HIV1RNAQUANT Not Detected 02/23/2021   Lab Results  Component Value Date   CD4TABS 297 (L) 02/23/2021   CD4TABS 195 (L) 09/01/2020   CD4TABS 274 (L) 05/19/2020    Changing him from [facility] to [facility] as described above.  Olecranon bursitis: This is resolved.  I spent 42 minutes with the patient including face to face counseling of the patient regarding the issues around weight gain and certain antiretrovirals different theories about why  this seems to happen more with certain ones versus others, reviewing his CBC CMP HIV viral load CD4 count along with review of medical records before and during the visit and in coordination of his care.

## 2021-04-14 ENCOUNTER — Other Ambulatory Visit: Payer: Self-pay

## 2021-04-14 DIAGNOSIS — B20 Human immunodeficiency virus [HIV] disease: Secondary | ICD-10-CM

## 2021-04-14 DIAGNOSIS — Z113 Encounter for screening for infections with a predominantly sexual mode of transmission: Secondary | ICD-10-CM

## 2021-04-15 LAB — URINE CYTOLOGY ANCILLARY ONLY
Chlamydia: NEGATIVE
Comment: NEGATIVE
Comment: NORMAL
Neisseria Gonorrhea: NEGATIVE

## 2021-04-15 LAB — T-HELPER CELL (CD4) - (RCID CLINIC ONLY)
CD4 % Helper T Cell: 19 % — ABNORMAL LOW (ref 33–65)
CD4 T Cell Abs: 229 /uL — ABNORMAL LOW (ref 400–1790)

## 2021-04-16 LAB — RPR

## 2021-04-16 LAB — COMPLETE METABOLIC PANEL WITH GFR

## 2021-04-16 LAB — CBC WITH DIFFERENTIAL/PLATELET

## 2021-04-16 LAB — HIV-1 RNA QUANT-NO REFLEX-BLD

## 2021-04-17 LAB — CBC WITH DIFFERENTIAL/PLATELET
Absolute Monocytes: 1029 cells/uL — ABNORMAL HIGH (ref 200–950)
Basophils Absolute: 30 cells/uL (ref 0–200)
Basophils Relative: 0.4 %
Eosinophils Absolute: 274 cells/uL (ref 15–500)
Eosinophils Relative: 3.7 %
HCT: 42.5 % (ref 38.5–50.0)
Hemoglobin: 14.4 g/dL (ref 13.2–17.1)
Lymphs Abs: 1177 cells/uL (ref 850–3900)
MCH: 30.8 pg (ref 27.0–33.0)
MCHC: 33.9 g/dL (ref 32.0–36.0)
MCV: 91 fL (ref 80.0–100.0)
MPV: 9.7 fL (ref 7.5–12.5)
Monocytes Relative: 13.9 %
Neutro Abs: 4891 cells/uL (ref 1500–7800)
Neutrophils Relative %: 66.1 %
Platelets: 276 10*3/uL (ref 140–400)
RBC: 4.67 10*6/uL (ref 4.20–5.80)
RDW: 13 % (ref 11.0–15.0)
Total Lymphocyte: 15.9 %
WBC: 7.4 10*3/uL (ref 3.8–10.8)

## 2021-04-17 LAB — COMPLETE METABOLIC PANEL WITH GFR
AG Ratio: 1.4 (calc) (ref 1.0–2.5)
ALT: 15 U/L (ref 9–46)
AST: 18 U/L (ref 10–35)
Albumin: 4.2 g/dL (ref 3.6–5.1)
Alkaline phosphatase (APISO): 92 U/L (ref 35–144)
BUN: 17 mg/dL (ref 7–25)
CO2: 26 mmol/L (ref 20–32)
Calcium: 9.1 mg/dL (ref 8.6–10.3)
Chloride: 104 mmol/L (ref 98–110)
Creat: 0.74 mg/dL (ref 0.70–1.30)
Globulin: 2.9 g/dL (calc) (ref 1.9–3.7)
Glucose, Bld: 95 mg/dL (ref 65–99)
Potassium: 3.9 mmol/L (ref 3.5–5.3)
Sodium: 137 mmol/L (ref 135–146)
Total Bilirubin: 0.5 mg/dL (ref 0.2–1.2)
Total Protein: 7.1 g/dL (ref 6.1–8.1)
eGFR: 106 mL/min/{1.73_m2} (ref >=60)

## 2021-04-17 LAB — HIV-1 RNA QUANT-NO REFLEX-BLD
HIV 1 RNA Quant: NOT DETECTED Copies/mL
HIV-1 RNA Quant, Log: NOT DETECTED Log cps/mL

## 2021-04-17 LAB — RPR: RPR Ser Ql: REACTIVE — AB

## 2021-04-17 LAB — RPR TITER: RPR Titer: 1:4 {titer} — ABNORMAL HIGH

## 2021-04-17 LAB — FLUORESCENT TREPONEMAL AB(FTA)-IGG-BLD: Fluorescent Treponemal ABS: REACTIVE — AB

## 2021-04-27 ENCOUNTER — Encounter: Payer: Self-pay | Admitting: Infectious Disease

## 2021-04-27 DIAGNOSIS — Z7185 Encounter for immunization safety counseling: Secondary | ICD-10-CM | POA: Insufficient documentation

## 2021-04-27 HISTORY — DX: Encounter for immunization safety counseling: Z71.85

## 2021-04-27 NOTE — Progress Notes (Signed)
Subjective:   Chief complaint : vivid dreams since being on doravirine    Patient ID: Jason [name], male    DOB: 01-31-1966, 56 y.o.   MRN: [id number]  HPI  Jason [name] is a 56 year old Caucasian man who lives with HIV that is been perfectly controlled on Biktarvy.   He was concerned about having gained at least 15 pounds while back on an integrase strand transfer inhibitor namely BIKTARVY. At our last visit in December of 2022 I switched him to [facility] and Descovy.   Repeat VL is ND.  He has noticed that he is having vivid dreams ever since he made the switch to Pifeltro and Descovy not bad dreams but vivid ones that sound remarkably similar to ones I used to hear on ppl on Efavirenz.    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) 09/01/2020   Malar rash 01/18/2017   Olecranon bursitis 03/02/2021   Osteoarthritis of left knee 10/02/2019   Right knee pain 10/11/2017   Scrotal ulcer 12/05/2018   Septic olecranon bursitis, left 11/27/2020   Thrush 03/03/2016   Weight gain 05/21/2019   Weight gain 05/21/2019    Past Surgical History:  Procedure Laterality Date   ESOPHAGOGASTRODUODENOSCOPY (EGD) WITH PROPOFOL N/A 09/10/2016   Procedure: ESOPHAGOGASTRODUODENOSCOPY (EGD) WITH PROPOFOL;  Surgeon: Clarene Essex, MD;  Location: [place];  Service: Endoscopy;  Laterality: N/A;    Family History  Problem Relation Age of Onset   Kidney disease Father       Social History   Socioeconomic History   Marital status: Single    Spouse name: Not on file   Number of children: Not on file   Years of education: Not on file   Highest education  level: Not on file  Occupational History   Not on file  Tobacco Use   Smoking status: Never   Smokeless tobacco: Never  Vaping Use   Vaping Use: Never used  Substance and Sexual Activity   Alcohol use: Yes    Comment: rare   Drug use: Yes    Types: Marijuana   Sexual activity: Not Currently    Partners: Male  Other Topics Concern   Not on file  Social History Narrative   Not on file   Social Determinants of Health   Financial Resource Strain: Not on file  Food Insecurity: Not on file  Transportation Needs: Not on file  Physical Activity: Not on file  Stress: Not on file  Social Connections: Not on file    Allergies  Allergen Reactions   Codeine Itching     Current Outpatient Medications:    bacitracin ointment, Apply 1 application topically 2 (two) times daily. (Patient not taking: Reported on 11/27/2020), Disp: 120 g, Rfl: 0   cephALEXin (KEFLEX) 500 MG capsule, Take 1 capsule (500 mg total) by mouth 4 (four) times daily. (Patient not taking: Reported on 03/02/2021), Disp: 28 capsule, Rfl: 0   Diclofenac Sodium CR 100 MG 24 hr tablet, Take 1 tablet (100 mg total) by mouth daily. (Patient not taking: Reported on 11/27/2020), Disp: 10 tablet, Rfl: 0   doravirine (PIFELTRO)  100 MG TABS tablet, Take 1 tablet (100 mg total) by mouth daily., Disp: 30 tablet, Rfl: 11   doxycycline (VIBRAMYCIN) 100 MG capsule, Take 1 capsule (100 mg total) by mouth 2 (two) times daily. One po bid x 7 days (Patient not taking: Reported on 03/02/2021), Disp: 14 capsule, Rfl: 0   emtricitabine-tenofovir AF (DESCOVY) 200-25 MG tablet, Take 1 tablet by mouth daily., Disp: 30 tablet, Rfl: 11   sodium fluoride (PREVIDENT 5000 PLUS) 1.1 % CREA dental cream, Brush on teeth with a toothbrush after evening mouth care. Spit out excess and do NOT rinse. (Patient not taking: Reported on 11/27/2020), Disp: 51 g, Rfl: 3     Review of Systems  Constitutional:  Negative for chills and fever.  HENT:  Negative for  congestion and sore throat.   Eyes:  Negative for photophobia.  Respiratory:  Negative for cough, shortness of breath and wheezing.   Cardiovascular:  Negative for chest pain, palpitations and leg swelling.  Gastrointestinal:  Negative for abdominal pain, blood in stool, constipation, diarrhea, nausea and vomiting.  Genitourinary:  Negative for dysuria, flank pain and hematuria.  Musculoskeletal:  Negative for back pain and myalgias.  Skin:  Negative for rash.  Neurological:  Negative for dizziness, weakness and headaches.  Hematological:  Does not bruise/bleed easily.  Psychiatric/Behavioral:  Positive for sleep disturbance. Negative for agitation, confusion, decreased concentration, dysphoric mood and suicidal ideas. The patient is not nervous/anxious.       Objective:   Physical Exam Constitutional:      Appearance: He is well-developed.  HENT:     Head: Normocephalic and atraumatic.  Eyes:     Conjunctiva/sclera: Conjunctivae normal.  Cardiovascular:     Rate and Rhythm: Normal rate and regular rhythm.  Pulmonary:     Effort: Pulmonary effort is normal. No respiratory distress.     Breath sounds: No wheezing.  Abdominal:     General: There is no distension.     Palpations: Abdomen is soft.  Musculoskeletal:        General: No tenderness. Normal range of motion.     Cervical back: Normal range of motion and neck supple.  Skin:    General: Skin is warm and dry.     Coloration: Skin is not pale.     Findings: No erythema or rash.  Neurological:     General: No focal deficit present.     Mental Status: He is alert and oriented to person, place, and time.  Psychiatric:        Mood and Affect: Mood normal.        Behavior: Behavior normal.        Thought Content: Thought content normal.        Judgment: Judgment normal.        Assessment & Plan:    HIV disease:  I have reviewed his VL from 04/14/21 which was ND and his CD4 from same date that was 229  Lab Results   Component Value Date   HIV1RNAQUANT Not Detected 04/14/2021   HIV1RNAQUANT CANCELED 04/14/2021   Lab Results  Component Value Date   CD4TABS 229 (L) 04/14/2021   CD4TABS 297 (L) 02/23/2021   CD4TABS 195 (L) 09/01/2020    I am continuing his Pifeltro and Descovy rx though he will take meds without food to see if reduces vivid dreams  I want him to let me know if these vivid dreams are bothering him too much because we can always switch therapy  yet again  Weight gain: we switched away from INSTI to see if this will help but I explained we do not completely understand mechanism for weight gain. It oculd also be that other ARVs are weight suppressing for ex  Carb restriction and dieting are advised as well  Vaccine counseling: recommended that he receive Prevnar 20 and he will come back for this and possibly flu shot but he is not going to have COVID vaccines he says.

## 2021-04-28 ENCOUNTER — Other Ambulatory Visit: Payer: Self-pay

## 2021-04-28 ENCOUNTER — Encounter: Payer: Self-pay | Admitting: Infectious Disease

## 2021-04-28 ENCOUNTER — Ambulatory Visit (INDEPENDENT_AMBULATORY_CARE_PROVIDER_SITE_OTHER): Payer: Self-pay | Admitting: Infectious Disease

## 2021-04-28 DIAGNOSIS — B20 Human immunodeficiency virus [HIV] disease: Secondary | ICD-10-CM

## 2021-04-28 DIAGNOSIS — R6889 Other general symptoms and signs: Secondary | ICD-10-CM

## 2021-04-28 DIAGNOSIS — R635 Abnormal weight gain: Secondary | ICD-10-CM

## 2021-04-28 DIAGNOSIS — Z7185 Encounter for immunization safety counseling: Secondary | ICD-10-CM

## 2021-04-28 HISTORY — DX: Other general symptoms and signs: R68.89

## 2021-07-12 ENCOUNTER — Other Ambulatory Visit: Payer: Self-pay

## 2021-07-14 ENCOUNTER — Other Ambulatory Visit: Payer: Self-pay

## 2021-07-14 DIAGNOSIS — B20 Human immunodeficiency virus [HIV] disease: Secondary | ICD-10-CM

## 2021-07-17 LAB — COMPLETE METABOLIC PANEL WITH GFR
AG Ratio: 1.3 (calc) (ref 1.0–2.5)
ALT: 16 U/L (ref 9–46)
AST: 20 U/L (ref 10–35)
Albumin: 4.3 g/dL (ref 3.6–5.1)
Alkaline phosphatase (APISO): 83 U/L (ref 35–144)
BUN: 16 mg/dL (ref 7–25)
CO2: 24 mmol/L (ref 20–32)
Calcium: 9.2 mg/dL (ref 8.6–10.3)
Chloride: 106 mmol/L (ref 98–110)
Creat: 0.78 mg/dL (ref 0.70–1.30)
Globulin: 3.2 g/dL (calc) (ref 1.9–3.7)
Glucose, Bld: 84 mg/dL (ref 65–99)
Potassium: 4.1 mmol/L (ref 3.5–5.3)
Sodium: 138 mmol/L (ref 135–146)
Total Bilirubin: 0.5 mg/dL (ref 0.2–1.2)
Total Protein: 7.5 g/dL (ref 6.1–8.1)
eGFR: 105 mL/min/{1.73_m2} (ref >=60)

## 2021-07-17 LAB — CBC WITH DIFFERENTIAL/PLATELET
Absolute Monocytes: 788 cells/uL (ref 200–950)
Basophils Absolute: 32 cells/uL (ref 0–200)
Basophils Relative: 0.6 %
Eosinophils Absolute: 178 cells/uL (ref 15–500)
Eosinophils Relative: 3.3 %
HCT: 45.2 % (ref 38.5–50.0)
Hemoglobin: 15 g/dL (ref 13.2–17.1)
Lymphs Abs: 1285 cells/uL (ref 850–3900)
MCH: 30.6 pg (ref 27.0–33.0)
MCHC: 33.2 g/dL (ref 32.0–36.0)
MCV: 92.2 fL (ref 80.0–100.0)
MPV: 10.1 fL (ref 7.5–12.5)
Monocytes Relative: 14.6 %
Neutro Abs: 3116 cells/uL (ref 1500–7800)
Neutrophils Relative %: 57.7 %
Platelets: 262 10*3/uL (ref 140–400)
RBC: 4.9 10*6/uL (ref 4.20–5.80)
RDW: 12.5 % (ref 11.0–15.0)
Total Lymphocyte: 23.8 %
WBC: 5.4 10*3/uL (ref 3.8–10.8)

## 2021-07-17 LAB — HIV-1 RNA QUANT-NO REFLEX-BLD
HIV 1 RNA Quant: NOT DETECTED copies/mL
HIV-1 RNA Quant, Log: NOT DETECTED Log copies/mL

## 2021-07-17 LAB — HELPER T-LYMPH-CD4 (ARMC ONLY)
% CD 4 Pos. Lymph.: 19.7 % — ABNORMAL LOW (ref 30.8–58.5)
Absolute CD 4 Helper: 276 /uL — ABNORMAL LOW (ref 359–1519)
Basophils Absolute: 0 10*3/uL (ref 0.0–0.2)
Basos: 0 %
EOS (ABSOLUTE): 0.2 10*3/uL (ref 0.0–0.4)
Eos: 4 %
Hematocrit: 46.3 % (ref 34.0–46.6)
Hemoglobin: 15.3 g/dL (ref 11.1–15.9)
Immature Grans (Abs): 0 10*3/uL (ref 0.0–0.1)
Immature Granulocytes: 0 %
Lymphocytes Absolute: 1.4 10*3/uL (ref 0.7–3.1)
Lymphs: 25 %
MCH: 31.2 pg (ref 26.6–33.0)
MCHC: 33 g/dL (ref 31.5–35.7)
MCV: 94 fL (ref 79–97)
Monocytes Absolute: 0.7 10*3/uL (ref 0.1–0.9)
Monocytes: 13 %
Neutrophils Absolute: 3.2 10*3/uL (ref 1.4–7.0)
Neutrophils: 58 %
Platelets: 270 10*3/uL (ref 150–450)
RBC: 4.91 x10E6/uL (ref 3.77–5.28)
RDW: 12.7 % (ref 11.7–15.4)
WBC: 5.5 10*3/uL (ref 3.4–10.8)

## 2021-07-17 LAB — LIPID PANEL
Cholesterol: 181 mg/dL (ref <200)
HDL: 43 mg/dL (ref >=40)
LDL Cholesterol (Calc): 116 mg/dL (calc) — ABNORMAL HIGH
Non-HDL Cholesterol (Calc): 138 mg/dL (calc) — ABNORMAL HIGH (ref <130)
Total CHOL/HDL Ratio: 4.2 (calc) (ref <5.0)
Triglycerides: 113 mg/dL (ref <150)

## 2021-07-17 LAB — RPR: RPR Ser Ql: REACTIVE — AB

## 2021-07-17 LAB — FLUORESCENT TREPONEMAL AB(FTA)-IGG-BLD: Fluorescent Treponemal ABS: REACTIVE — AB

## 2021-07-17 LAB — RPR TITER: RPR Titer: 1:2 {titer} — ABNORMAL HIGH

## 2021-07-20 LAB — T-HELPER CELL (CD4) - (RCID CLINIC ONLY)

## 2021-07-26 ENCOUNTER — Ambulatory Visit: Payer: Self-pay | Admitting: Infectious Disease

## 2021-07-29 ENCOUNTER — Encounter: Payer: Self-pay | Admitting: Infectious Disease

## 2021-07-29 ENCOUNTER — Other Ambulatory Visit: Payer: Self-pay

## 2021-07-29 ENCOUNTER — Ambulatory Visit (INDEPENDENT_AMBULATORY_CARE_PROVIDER_SITE_OTHER): Payer: Self-pay | Admitting: Infectious Disease

## 2021-07-29 VITALS — BP 134/78 | HR 70 | Resp 16 | Ht 68.0 in | Wt 183.0 lb

## 2021-07-29 DIAGNOSIS — B20 Human immunodeficiency virus [HIV] disease: Secondary | ICD-10-CM

## 2021-07-29 DIAGNOSIS — E785 Hyperlipidemia, unspecified: Secondary | ICD-10-CM

## 2021-07-29 DIAGNOSIS — R85612 Low grade squamous intraepithelial lesion on cytologic smear of anus (LGSIL): Secondary | ICD-10-CM

## 2021-07-29 DIAGNOSIS — R6889 Other general symptoms and signs: Secondary | ICD-10-CM

## 2021-07-29 DIAGNOSIS — B079 Viral wart, unspecified: Secondary | ICD-10-CM

## 2021-07-29 HISTORY — DX: Viral wart, unspecified: B07.9

## 2021-07-29 MED ORDER — PITAVASTATIN CALCIUM 4 MG PO TABS: 4.0000 mg | ORAL_TABLET | Freq: Every day | ORAL | 11 refills | Status: DC

## 2021-07-29 MED ORDER — DORAVIRINE 100 MG PO TABS: 100.0000 mg | ORAL_TABLET | Freq: Every day | ORAL | 11 refills | Status: DC

## 2021-07-29 MED ORDER — IMIQUIMOD 5 % EX CREA: TOPICAL_CREAM | CUTANEOUS | 2 refills | Status: DC

## 2021-07-29 MED ORDER — EMTRICITABINE-TENOFOVIR AF 200-25 MG PO TABS: 1.0000 | ORAL_TABLET | Freq: Every day | ORAL | 11 refills | Status: DC

## 2021-07-29 NOTE — Progress Notes (Signed)
? ?Subjective:  ? ?Chief complaint : Follow-up for HIV on medications still having vivid dreams on Pifeltro but that it does not bother him ? ? ? Patient ID: Jason [name], male    DOB: Jan 17, 1966, 56 y.o.   MRN: [id number] ? ?HPI ? ?Jason [name] is a 56 year old Caucasian [name] who lives with HIV that is been perfectly controlled on Biktarvy. ? ? ?He was concerned about having gained at least 15 pounds while back on an integrase strand transfer inhibitor namely BIKTARVY. At our last visit in December of 2022 I switched him to [facility] and Descovy. ? ? ?Repeat VL is ND. ? ?He has noticed that he is having vivid dreams ever since he made the switch to Pifeltro and Descovy not bad dreams but vivid ones that sound remarkably similar to ones I used to hear on ppl on Efavirenz. ? ?He has had vivid dreams but they do not bother him very much and he feels better now that his weight has stabilized after having come off of integrase strand transfer inhibitor. ? ?He has a area on his hand that he is concerned might be a wart is not gone away. ? ? ? ? ? ?Past Medical History:  ?Diagnosis Date  ? AIDS ([place]) 01/18/2016  ? 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 Bloating symptom 10/11/2017  ? Depression 01/18/2016  ? Dysphagia 07/19/2016  ? Facial rash 03/03/2016  ? Hepatitis C   ? HIV positive ([place])   ? Inguinal hernia 01/18/2017  ? Left elbow pain 08/15/2018  ? 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 Malar rash 01/18/2017  ? Olecranon bursitis 03/02/2021  ? Osteoarthritis of left knee 10/02/2019  ? Right knee pain 10/11/2017  ? Scrotal ulcer 12/05/2018  ? Septic olecranon bursitis, left 11/27/2020  ? Thrush 03/03/2016  ? Vaccine counseling 04/27/2021  ? Vivid dream 04/28/2021  ? Weight gain 05/21/2019  ? Weight gain 05/21/2019  ? ? ?Past Surgical History:  ?Procedure Laterality Date  ? ESOPHAGOGASTRODUODENOSCOPY (EGD) WITH PROPOFOL N/A 09/10/2016  ? Procedure: ESOPHAGOGASTRODUODENOSCOPY  (EGD) WITH PROPOFOL;  Surgeon: Clarene Essex, MD;  Location: [place];  Service: Endoscopy;  Laterality: N/A;  ? ? ?Family History  ?Problem Relation Age of Onset  ? Kidney disease Father   ? ? ?  ?Social History  ? ?Socioeconomic History  ? Marital status: Single  ?  Spouse name: Not on file  ? Number of children: Not on file  ? Years of education: Not on file  ? Highest education level: Not on file  ?Occupational History  ? Not on file  ?Tobacco Use  ? Smoking status: Never  ? Smokeless tobacco: Never  ?Vaping Use  ? Vaping Use: Never used  ?Substance and Sexual Activity  ? Alcohol use: Yes  ?  Comment: rare  ? Drug use: Yes  ?  Types: Marijuana  ? Sexual activity: Not Currently  ?  Partners: Male  ?Other Topics Concern  ? Not on file  ?Social History Narrative  ? Not on file  ? ?Social Determinants of Health  ? ?Financial Resource Strain: Not on file  ?Food Insecurity: Not on file  ?Transportation Needs: Not on file  ?Physical Activity: Not on file  ?Stress: Not on file  ?Social Connections: Not on file  ? ? ?Allergies  ?Allergen Reactions  ? Codeine Itching  ? ? ? ?Current Outpatient Medications:  ?  doravirine (PIFELTRO) 100 MG TABS tablet, Take 1  tablet (100 mg total) by mouth daily., Disp: 30 tablet, Rfl: 11 ?  emtricitabine-tenofovir AF (DESCOVY) 200-25 MG tablet, Take 1 tablet by mouth daily., Disp: 30 tablet, Rfl: 11 ?  bacitracin ointment, Apply 1 application topically 2 (two) times daily. (Patient not taking: Reported on 11/27/2020), Disp: 120 g, Rfl: 0 ?  cephALEXin (KEFLEX) 500 MG capsule, Take 1 capsule (500 mg total) by mouth 4 (four) times daily. (Patient not taking: Reported on 03/02/2021), Disp: 28 capsule, Rfl: 0 ?  Diclofenac Sodium CR 100 MG 24 hr tablet, Take 1 tablet (100 mg total) by mouth daily. (Patient not taking: Reported on 11/27/2020), Disp: 10 tablet, Rfl: 0 ?  doxycycline (VIBRAMYCIN) 100 MG capsule, Take 1 capsule (100 mg total) by mouth 2 (two) times daily. One po bid x 7 days  (Patient not taking: Reported on 03/02/2021), Disp: 14 capsule, Rfl: 0 ?  sodium fluoride (PREVIDENT 5000 PLUS) 1.1 % CREA dental cream, Brush on teeth with a toothbrush after evening mouth care. Spit out excess and do NOT rinse. (Patient not taking: Reported on 11/27/2020), Disp: 51 g, Rfl: 3 ? ? ? ? ?Review of Systems  ?Constitutional:  Negative for activity change, appetite change, chills, diaphoresis, fatigue, fever and unexpected weight change.  ?HENT:  Negative for congestion, rhinorrhea, sinus pressure, sneezing, sore throat and trouble swallowing.   ?Eyes:  Negative for photophobia and visual disturbance.  ?Respiratory:  Negative for cough, chest tightness, shortness of breath, wheezing and stridor.   ?Cardiovascular:  Negative for chest pain, palpitations and leg swelling.  ?Gastrointestinal:  Negative for abdominal distention, abdominal pain, anal bleeding, blood in stool, constipation, diarrhea, nausea and vomiting.  ?Genitourinary:  Negative for difficulty urinating, dysuria, flank pain and hematuria.  ?Musculoskeletal:  Negative for arthralgias, back pain, gait problem, joint swelling and myalgias.  ?Skin:  Positive for color change. Negative for pallor, rash and wound.  ?Neurological:  Negative for dizziness, tremors, weakness, light-headedness and headaches.  ?Hematological:  Negative for adenopathy. Does not bruise/bleed easily.  ?Psychiatric/Behavioral:  Negative for agitation, behavioral problems, confusion, decreased concentration, dysphoric mood, sleep disturbance and suicidal ideas.   ? ?   ?Objective:  ? Physical Exam ?Constitutional:   ?   Appearance: He is well-developed.  ?HENT:  ?   Head: Normocephalic and atraumatic.  ?Eyes:  ?   Conjunctiva/sclera: Conjunctivae normal.  ?Cardiovascular:  ?   Rate and Rhythm: Normal rate and regular rhythm.  ?Pulmonary:  ?   Effort: Pulmonary effort is normal. No respiratory distress.  ?   Breath sounds: No wheezing.  ?Abdominal:  ?   General: There is no  distension.  ?   Palpations: Abdomen is soft.  ?Musculoskeletal:     ?   General: No tenderness. Normal range of motion.  ?   Cervical back: Normal range of motion and neck supple.  ?Skin: ?   General: Skin is warm and dry.  ?   Coloration: Skin is not pale.  ?   Findings: No erythema or rash.  ?Neurological:  ?   General: No focal deficit present.  ?   Mental Status: He is alert and oriented to person, place, and time.  ?Psychiatric:     ?   Mood and Affect: Mood normal.     ?   Behavior: Behavior normal.     ?   Thought Content: Thought content normal.     ?   Judgment: Judgment normal.  ? ? ?Wart on hand 07/29/2021: ? ? ? ? ?   ?  Assessment & Plan:  ? ?HIV disease: ? ?I reviewed his most recent viral load from July 14, 2021 which was not detected and CD4 count from same date which was 276. ? ?I am continue his Pifeltro and DESCOVY ? ? ?LGSIL: Has been in the anchor study and is following with Dr. Janalyn Harder ? ?Weight gain: Stabilized after switch off intergrade strand transfer inhibitor ? ? ?Vivid dreams: Continue but do not bother him ? ?CV prevention: Start to have a statin ? ?Possible wart will prescribe Aldara ? ? ? ? ?

## 2022-01-17 ENCOUNTER — Other Ambulatory Visit: Payer: Self-pay

## 2022-01-31 ENCOUNTER — Ambulatory Visit: Payer: Self-pay | Admitting: Infectious Disease

## 2022-02-07 ENCOUNTER — Other Ambulatory Visit: Payer: Self-pay

## 2022-02-08 ENCOUNTER — Other Ambulatory Visit: Payer: Self-pay

## 2022-02-09 ENCOUNTER — Other Ambulatory Visit: Payer: Self-pay

## 2022-02-09 DIAGNOSIS — B20 Human immunodeficiency virus [HIV] disease: Secondary | ICD-10-CM

## 2022-02-09 DIAGNOSIS — Z113 Encounter for screening for infections with a predominantly sexual mode of transmission: Secondary | ICD-10-CM

## 2022-02-10 LAB — URINE CYTOLOGY ANCILLARY ONLY
Chlamydia: NEGATIVE
Comment: NEGATIVE
Comment: NORMAL
Neisseria Gonorrhea: NEGATIVE

## 2022-02-10 LAB — T-HELPER CELL (CD4) - (RCID CLINIC ONLY)
CD4 % Helper T Cell: 19 % — ABNORMAL LOW (ref 33–65)
CD4 T Cell Abs: 210 /uL — ABNORMAL LOW (ref 400–1790)

## 2022-02-12 LAB — CBC WITH DIFFERENTIAL/PLATELET
Absolute Monocytes: 556 cells/uL (ref 200–950)
Basophils Absolute: 33 cells/uL (ref 0–200)
Basophils Relative: 0.6 %
Eosinophils Absolute: 193 cells/uL (ref 15–500)
Eosinophils Relative: 3.5 %
HCT: 42.5 % (ref 38.5–50.0)
Hemoglobin: 14.6 g/dL (ref 13.2–17.1)
Lymphs Abs: 1089 cells/uL (ref 850–3900)
MCH: 31.5 pg (ref 27.0–33.0)
MCHC: 34.4 g/dL (ref 32.0–36.0)
MCV: 91.8 fL (ref 80.0–100.0)
MPV: 9.6 fL (ref 7.5–12.5)
Monocytes Relative: 10.1 %
Neutro Abs: 3630 cells/uL (ref 1500–7800)
Neutrophils Relative %: 66 %
Platelets: 283 10*3/uL (ref 140–400)
RBC: 4.63 10*6/uL (ref 4.20–5.80)
RDW: 12.6 % (ref 11.0–15.0)
Total Lymphocyte: 19.8 %
WBC: 5.5 10*3/uL (ref 3.8–10.8)

## 2022-02-12 LAB — COMPLETE METABOLIC PANEL WITH GFR
AG Ratio: 1.5 (calc) (ref 1.0–2.5)
ALT: 19 U/L (ref 9–46)
AST: 20 U/L (ref 10–35)
Albumin: 4.4 g/dL (ref 3.6–5.1)
Alkaline phosphatase (APISO): 77 U/L (ref 35–144)
BUN: 16 mg/dL (ref 7–25)
CO2: 25 mmol/L (ref 20–32)
Calcium: 9.4 mg/dL (ref 8.6–10.3)
Chloride: 105 mmol/L (ref 98–110)
Creat: 0.71 mg/dL (ref 0.70–1.30)
Globulin: 2.9 g/dL (calc) (ref 1.9–3.7)
Glucose, Bld: 95 mg/dL (ref 65–99)
Potassium: 3.7 mmol/L (ref 3.5–5.3)
Sodium: 139 mmol/L (ref 135–146)
Total Bilirubin: 0.5 mg/dL (ref 0.2–1.2)
Total Protein: 7.3 g/dL (ref 6.1–8.1)
eGFR: 107 mL/min/{1.73_m2} (ref >=60)

## 2022-02-12 LAB — LIPID PANEL
Cholesterol: 212 mg/dL — ABNORMAL HIGH (ref <200)
HDL: 44 mg/dL (ref >=40)
LDL Cholesterol (Calc): 134 mg/dL (calc) — ABNORMAL HIGH
Non-HDL Cholesterol (Calc): 168 mg/dL (calc) — ABNORMAL HIGH (ref <130)
Total CHOL/HDL Ratio: 4.8 (calc) (ref <5.0)
Triglycerides: 202 mg/dL — ABNORMAL HIGH (ref <150)

## 2022-02-12 LAB — HIV-1 RNA QUANT-NO REFLEX-BLD
HIV 1 RNA Quant: NOT DETECTED Copies/mL
HIV-1 RNA Quant, Log: NOT DETECTED Log cps/mL

## 2022-02-12 LAB — FLUORESCENT TREPONEMAL AB(FTA)-IGG-BLD: Fluorescent Treponemal ABS: REACTIVE — AB

## 2022-02-12 LAB — RPR TITER: RPR Titer: 1:2 {titer} — ABNORMAL HIGH

## 2022-02-12 LAB — RPR: RPR Ser Ql: REACTIVE — AB

## 2022-02-13 ENCOUNTER — Encounter: Payer: Self-pay | Admitting: Family Medicine

## 2022-02-14 ENCOUNTER — Encounter: Payer: Self-pay | Admitting: Infectious Disease

## 2022-02-21 ENCOUNTER — Other Ambulatory Visit: Payer: Self-pay

## 2022-02-21 ENCOUNTER — Ambulatory Visit (INDEPENDENT_AMBULATORY_CARE_PROVIDER_SITE_OTHER): Payer: Self-pay | Admitting: Infectious Disease

## 2022-02-21 ENCOUNTER — Encounter: Payer: Self-pay | Admitting: Infectious Disease

## 2022-02-21 VITALS — BP 125/80 | HR 70 | Temp 97.8°F | Ht 68.0 in | Wt 179.0 lb

## 2022-02-21 DIAGNOSIS — Z23 Encounter for immunization: Secondary | ICD-10-CM

## 2022-02-21 DIAGNOSIS — B20 Human immunodeficiency virus [HIV] disease: Secondary | ICD-10-CM

## 2022-02-21 DIAGNOSIS — E785 Hyperlipidemia, unspecified: Secondary | ICD-10-CM

## 2022-02-21 DIAGNOSIS — Z7185 Encounter for immunization safety counseling: Secondary | ICD-10-CM

## 2022-02-21 DIAGNOSIS — Z202 Contact with and (suspected) exposure to infections with a predominantly sexual mode of transmission: Secondary | ICD-10-CM

## 2022-02-21 MED ORDER — PITAVASTATIN CALCIUM 4 MG PO TABS: 4.0000 mg | ORAL_TABLET | Freq: Every day | ORAL | 11 refills | Status: DC

## 2022-02-21 MED ORDER — EMTRICITABINE-TENOFOVIR AF 200-25 MG PO TABS: 1.0000 | ORAL_TABLET | Freq: Every day | ORAL | 11 refills | Status: DC

## 2022-02-21 MED ORDER — DORAVIRINE 100 MG PO TABS: 100.0000 mg | ORAL_TABLET | Freq: Every day | ORAL | 11 refills | Status: DC

## 2022-02-21 MED ORDER — PENICILLIN G BENZATHINE 1200000 UNIT/2ML IM SUSY
1.2000 10*6.[IU] | PREFILLED_SYRINGE | Freq: Once | INTRAMUSCULAR | Status: AC
  Administered 2022-02-21: 1.2 10*6.[IU] via INTRAMUSCULAR

## 2022-02-21 NOTE — Progress Notes (Signed)
Subjective:   Chief complaint : Concern with regards to syphilis exposure with him his partner   Patient ID: Jason [name], male    DOB: 1965-10-02, 56 y.o.   MRN: [id number]  HPI  Jason [name] is a 56 year-old Caucasian man who lives with HIV that is been perfectly controlled on Biktarvy --> Pifeltro and Descovy.  His partner who is also HIV positive but not been on medications it was apparently hospitalized at [facility] with neurosyphilis and was treated with IV penicillin.  He is still learning to walk better and still has some impaired cognition.  He has BIKTARVY that was given to him at [facility] but is about to run out of the next couple of weeks have asked Saba to get him connected to care with [place] soon as possible.  Present is also concerned about risk to himself of having syphilis since they were having unprotected sex over quite a long period of time while his titer was 1-2 at negative.  Give him 2,400,000 units of Bicillin given exposure to a patient with known syphilis (though I suspect his partners was more likely due to reactivated in the CNS I think we should give Jason [name] bicillin)  Discussed his concerns about pitavastatin.  Currently some of his friends had warned about it potentially causing joint problems I counseled that does not cause joint problems but can cause myositis a rare side effect that we monitor for and I counseled him with regards to myositis would present.        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   Malar rash 01/18/2017   Olecranon bursitis 03/02/2021    Osteoarthritis of left knee 10/02/2019   Right knee pain 10/11/2017   Scrotal ulcer 12/05/2018   Septic olecranon bursitis, left 11/27/2020   Thrush 03/03/2016   Vaccine counseling 04/27/2021   Vivid dream 04/28/2021   Wart of hand 07/29/2021   Weight gain 05/21/2019   Weight gain 05/21/2019    Past Surgical History:  Procedure Laterality Date   ESOPHAGOGASTRODUODENOSCOPY (EGD) WITH PROPOFOL N/A 09/10/2016   Procedure: ESOPHAGOGASTRODUODENOSCOPY (EGD) WITH PROPOFOL;  Surgeon: Vida Rigger, MD;  Location: [facility] ENDOSCOPY;  Service: Endoscopy;  Laterality: N/A;    Family History  Problem Relation Age of Onset   Kidney disease Father       Social History   Socioeconomic History   Marital status: Single    Spouse name: Not on file   Number of children: Not on file   Years of education: Not on file   Highest education level: Not on file  Occupational History   Not on file  Tobacco Use   Smoking status: Never   Smokeless tobacco: Never  Vaping Use   Vaping Use: Never used  Substance and Sexual Activity   Alcohol use: Yes    Comment: rare   Drug use: Yes    Types: Marijuana   Sexual activity: Not Currently    Partners: Male    Comment: declined condoms  Other Topics Concern   Not on file  Social History Narrative  Not on file   Social Determinants of Health   Financial Resource Strain: Not on file  Food Insecurity: Not on file  Transportation Needs: Not on file  Physical Activity: Not on file  Stress: Not on file  Social Connections: Not on file    Allergies  Allergen Reactions   Codeine Itching     Current Outpatient Medications:    doravirine (PIFELTRO) 100 MG TABS tablet, Take 1 tablet (100 mg total) by mouth daily., Disp: 30 tablet, Rfl: 11   emtricitabine-tenofovir AF (DESCOVY) 200-25 MG tablet, Take 1 tablet by mouth daily., Disp: 30 tablet, Rfl: 11   Pitavastatin Calcium 4 MG TABS, Take 1 tablet (4 mg total) by mouth daily., Disp: 30 tablet, Rfl: 11   bacitracin  ointment, Apply 1 application topically 2 (two) times daily. (Patient not taking: Reported on 11/27/2020), Disp: 120 g, Rfl: 0   cephALEXin (KEFLEX) 500 MG capsule, Take 1 capsule (500 mg total) by mouth 4 (four) times daily. (Patient not taking: Reported on 03/02/2021), Disp: 28 capsule, Rfl: 0   Diclofenac Sodium CR 100 MG 24 hr tablet, Take 1 tablet (100 mg total) by mouth daily. (Patient not taking: Reported on 11/27/2020), Disp: 10 tablet, Rfl: 0   doxycycline (VIBRAMYCIN) 100 MG capsule, Take 1 capsule (100 mg total) by mouth 2 (two) times daily. One po bid x 7 days (Patient not taking: Reported on 03/02/2021), Disp: 14 capsule, Rfl: 0   imiquimod (ALDARA) 5 % cream, Apply topically 3 (three) times a week. To lesion on hand (Patient not taking: Reported on 02/21/2022), Disp: 12 each, Rfl: 2   sodium fluoride (PREVIDENT 5000 PLUS) 1.1 % CREA dental cream, Brush on teeth with a toothbrush after evening mouth care. Spit out excess and do NOT rinse. (Patient not taking: Reported on 11/27/2020), Disp: 51 g, Rfl: 3     Review of Systems  Constitutional:  Negative for activity change, appetite change, chills, diaphoresis, fatigue, fever and unexpected weight change.  HENT:  Negative for congestion, rhinorrhea, sinus pressure, sneezing, sore throat and trouble swallowing.   Eyes:  Negative for photophobia and visual disturbance.  Respiratory:  Negative for cough, chest tightness, shortness of breath, wheezing and stridor.   Cardiovascular:  Negative for chest pain, palpitations and leg swelling.  Gastrointestinal:  Negative for abdominal distention, abdominal pain, anal bleeding, blood in stool, constipation, diarrhea, nausea and vomiting.  Genitourinary:  Negative for difficulty urinating, dysuria, flank pain and hematuria.  Musculoskeletal:  Negative for arthralgias, back pain, gait problem, joint swelling and myalgias.  Skin:  Positive for color change. Negative for pallor, rash and wound.   Neurological:  Negative for dizziness, tremors, weakness, light-headedness and headaches.  Hematological:  Negative for adenopathy. Does not bruise/bleed easily.  Psychiatric/Behavioral:  Negative for agitation, behavioral problems, confusion, decreased concentration, dysphoric mood, sleep disturbance and suicidal ideas.        Objective:   Physical Exam Constitutional:      Appearance: He is well-developed.  HENT:     Head: Normocephalic and atraumatic.  Eyes:     Conjunctiva/sclera: Conjunctivae normal.  Cardiovascular:     Rate and Rhythm: Normal rate and regular rhythm.  Pulmonary:     Effort: Pulmonary effort is normal. No respiratory distress.     Breath sounds: No wheezing.  Abdominal:     General: There is no distension.     Palpations: Abdomen is soft.  Musculoskeletal:        General: No tenderness. Normal range of  motion.     Cervical back: Normal range of motion and neck supple.  Skin:    General: Skin is warm and dry.     Coloration: Skin is not pale.     Findings: No erythema or rash.  Neurological:     General: No focal deficit present.     Mental Status: He is alert and oriented to person, place, and time.  Psychiatric:        Mood and Affect: Mood normal.        Behavior: Behavior normal.        Thought Content: Thought content normal.        Judgment: Judgment normal.        Assessment & Plan:   HIV disease:  I have reviewed [facility] labs including viral load which was  Lab Results  Component Value Date   HIV1RNAQUANT Not Detected 02/09/2022   and cd4 which was  Lab Results  Component Value Date   CD4TABS 210 (L) 02/09/2022     I am continuing patient's prescription for Pifeltro and DESCOVY   LGSIL: Has been in the anchor study and is following with Dr. Maurice March  Weight gain: Stabilized after switch off intergrade strand transfer inhibitor   Vivid dreams: Continue but do not bother him  CV prevention: I am restarting his  pitavastatin  Visual symptoms will give 2,400,000 units of Bicillin  Vaccine counseling recommended COVID-19 and flu shot he was agreeable to the latter.

## 2022-02-21 NOTE — Addendum Note (Signed)
Addended by: Philippa Chester on: 02/21/2022 11:53 AM   Modules accepted: Orders

## 2022-05-31 ENCOUNTER — Encounter: Payer: Self-pay | Admitting: Infectious Disease

## 2022-08-01 ENCOUNTER — Other Ambulatory Visit: Payer: Self-pay

## 2022-08-02 ENCOUNTER — Other Ambulatory Visit: Payer: Self-pay

## 2022-08-03 ENCOUNTER — Other Ambulatory Visit: Payer: Self-pay

## 2022-08-03 DIAGNOSIS — E785 Hyperlipidemia, unspecified: Secondary | ICD-10-CM

## 2022-08-03 DIAGNOSIS — B20 Human immunodeficiency virus [HIV] disease: Secondary | ICD-10-CM

## 2022-08-03 DIAGNOSIS — Z7185 Encounter for immunization safety counseling: Secondary | ICD-10-CM

## 2022-08-04 LAB — URINE CYTOLOGY ANCILLARY ONLY
Chlamydia: NEGATIVE
Comment: NEGATIVE
Comment: NORMAL
Neisseria Gonorrhea: NEGATIVE

## 2022-08-04 LAB — T-HELPER CELL (CD4) - (RCID CLINIC ONLY)
CD4 % Helper T Cell: 23 % — ABNORMAL LOW (ref 33–65)
CD4 T Cell Abs: 272 /uL — ABNORMAL LOW (ref 400–1790)

## 2022-08-05 LAB — COMPLETE METABOLIC PANEL WITH GFR
AG Ratio: 1.4 (calc) (ref 1.0–2.5)
ALT: 16 U/L (ref 9–46)
AST: 19 U/L (ref 10–35)
Albumin: 4.1 g/dL (ref 3.6–5.1)
Alkaline phosphatase (APISO): 62 U/L (ref 35–144)
BUN: 16 mg/dL (ref 7–25)
CO2: 23 mmol/L (ref 20–32)
Calcium: 9.3 mg/dL (ref 8.6–10.3)
Chloride: 105 mmol/L (ref 98–110)
Creat: 0.75 mg/dL (ref 0.70–1.30)
Globulin: 3 g/dL (calc) (ref 1.9–3.7)
Glucose, Bld: 116 mg/dL — ABNORMAL HIGH (ref 65–99)
Potassium: 3.7 mmol/L (ref 3.5–5.3)
Sodium: 138 mmol/L (ref 135–146)
Total Bilirubin: 0.5 mg/dL (ref 0.2–1.2)
Total Protein: 7.1 g/dL (ref 6.1–8.1)
eGFR: 105 mL/min/{1.73_m2} (ref >=60)

## 2022-08-05 LAB — CBC WITH DIFFERENTIAL/PLATELET
Absolute Monocytes: 756 cells/uL (ref 200–950)
Basophils Absolute: 28 cells/uL (ref 0–200)
Basophils Relative: 0.5 %
Eosinophils Absolute: 207 cells/uL (ref 15–500)
Eosinophils Relative: 3.7 %
HCT: 41.9 % (ref 38.5–50.0)
Hemoglobin: 13.9 g/dL (ref 13.2–17.1)
Lymphs Abs: 1322 cells/uL (ref 850–3900)
MCH: 30.5 pg (ref 27.0–33.0)
MCHC: 33.2 g/dL (ref 32.0–36.0)
MCV: 92.1 fL (ref 80.0–100.0)
MPV: 9.6 fL (ref 7.5–12.5)
Monocytes Relative: 13.5 %
Neutro Abs: 3287 cells/uL (ref 1500–7800)
Neutrophils Relative %: 58.7 %
Platelets: 278 10*3/uL (ref 140–400)
RBC: 4.55 10*6/uL (ref 4.20–5.80)
RDW: 12.3 % (ref 11.0–15.0)
Total Lymphocyte: 23.6 %
WBC: 5.6 10*3/uL (ref 3.8–10.8)

## 2022-08-05 LAB — T PALLIDUM AB: T Pallidum Abs: POSITIVE — AB

## 2022-08-05 LAB — HIV-1 RNA QUANT-NO REFLEX-BLD
HIV 1 RNA Quant: <20 Copies/mL — ABNORMAL HIGH
HIV-1 RNA Quant, Log: <1.3 Log cps/mL — ABNORMAL HIGH

## 2022-08-05 LAB — RPR TITER: RPR Titer: 1:2 {titer} — ABNORMAL HIGH

## 2022-08-05 LAB — RPR: RPR Ser Ql: REACTIVE — AB

## 2022-08-05 LAB — LIPID PANEL
Cholesterol: 148 mg/dL (ref <200)
HDL: 47 mg/dL (ref >=40)
LDL Cholesterol (Calc): 78 mg/dL (calc)
Non-HDL Cholesterol (Calc): 101 mg/dL (calc) (ref <130)
Total CHOL/HDL Ratio: 3.1 (calc) (ref <5.0)
Triglycerides: 129 mg/dL (ref <150)

## 2022-08-15 ENCOUNTER — Encounter: Payer: Self-pay | Admitting: Infectious Disease

## 2022-08-15 ENCOUNTER — Ambulatory Visit (INDEPENDENT_AMBULATORY_CARE_PROVIDER_SITE_OTHER): Payer: Self-pay | Admitting: Infectious Disease

## 2022-08-15 ENCOUNTER — Other Ambulatory Visit: Payer: Self-pay

## 2022-08-15 VITALS — BP 158/96 | HR 65 | Temp 97.4°F | Wt 166.0 lb

## 2022-08-15 DIAGNOSIS — R85612 Low grade squamous intraepithelial lesion on cytologic smear of anus (LGSIL): Secondary | ICD-10-CM

## 2022-08-15 DIAGNOSIS — Z1211 Encounter for screening for malignant neoplasm of colon: Secondary | ICD-10-CM

## 2022-08-15 DIAGNOSIS — A539 Syphilis, unspecified: Secondary | ICD-10-CM

## 2022-08-15 DIAGNOSIS — K5900 Constipation, unspecified: Secondary | ICD-10-CM

## 2022-08-15 DIAGNOSIS — R634 Abnormal weight loss: Secondary | ICD-10-CM

## 2022-08-15 DIAGNOSIS — B20 Human immunodeficiency virus [HIV] disease: Secondary | ICD-10-CM

## 2022-08-15 DIAGNOSIS — R39198 Other difficulties with micturition: Secondary | ICD-10-CM

## 2022-08-15 HISTORY — DX: Other difficulties with micturition: R39.198

## 2022-08-15 HISTORY — DX: Constipation, unspecified: K59.00

## 2022-08-15 HISTORY — DX: Syphilis, unspecified: A53.9

## 2022-08-15 MED ORDER — EMTRICITABINE-TENOFOVIR AF 200-25 MG PO TABS: 1.0000 | ORAL_TABLET | Freq: Every day | ORAL | 11 refills | Status: DC

## 2022-08-15 MED ORDER — DORAVIRINE 100 MG PO TABS: 100.0000 mg | ORAL_TABLET | Freq: Every day | ORAL | 11 refills | Status: DC

## 2022-08-15 MED ORDER — PITAVASTATIN CALCIUM 4 MG PO TABS: 4.0000 mg | ORAL_TABLET | Freq: Every day | ORAL | 11 refills | Status: DC

## 2022-08-15 NOTE — Patient Instructions (Signed)
If no appt available with me can schedule with one of my partners

## 2022-08-15 NOTE — Progress Notes (Signed)
Subjective:   Chief complaint :  followup for HIV disease on medications including problems and constipation last week, Will with multiple complaints and concern difficulty emptying his bladder despite feeling that his bladder is full and distended,   Patient ID: Jason [name], male    DOB: May 28, 1965, 57 y.o.   MRN: [id number]  HPI  Jason [name] is a 57 year-old Caucasian man who lives with HIV that is been perfectly controlled on Biktarvy --> Pifeltro and Descovy.   Past fall when I saw Raegan his partner had been diagnosed with neurosyphilis and he was concerned about having syphilis.  We tested him for syphilis his RPR was low at 1-2 but we given also benzathine penicillin at the same visit 2,400,000 units.  Titers have remained unchanged in the interim.  He says that his partner is improving cognition but still with some deficits.  He has been suffering from constipation for the last week.  Prior to that he was having difficulty emptying his bladder which continues to be a problem he also is having trouble having to get up at night to urinate.  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   Malar rash 01/18/2017   Olecranon bursitis 03/02/2021   Osteoarthritis of left knee 10/02/2019   Right knee pain 10/11/2017   Scrotal ulcer 12/05/2018   Septic olecranon bursitis, left 11/27/2020   Thrush 03/03/2016   Vaccine counseling 04/27/2021   Vivid dream  04/28/2021   Wart of hand 07/29/2021   Weight gain 05/21/2019   Weight gain 05/21/2019    Past Surgical History:  Procedure Laterality Date   ESOPHAGOGASTRODUODENOSCOPY (EGD) WITH PROPOFOL N/A 09/10/2016   Procedure: ESOPHAGOGASTRODUODENOSCOPY (EGD) WITH PROPOFOL;  Surgeon: Vida Rigger, MD;  Location: [facility] ENDOSCOPY;  Service: Endoscopy;  Laterality: N/A;    Family History  Problem Relation Age of Onset   Kidney disease Father       Social History   Socioeconomic History   Marital status: Single    Spouse name: Not on file   Number of children: Not on file   Years of education: Not on file   Highest education level: Not on file  Occupational History   Not on file  Tobacco Use   Smoking status: Never   Smokeless tobacco: Never  Vaping Use   Vaping Use: Never used  Substance and Sexual Activity   Alcohol use: Yes    Comment: rare   Drug use: Yes    Types: Marijuana   Sexual activity: Not Currently    Partners: Male    Comment: declined condoms  Other Topics Concern   Not on file  Social History Narrative   Not on file   Social Determinants of Health   Financial Resource Strain: Not on file  Food Insecurity: Not on file  Transportation Needs: Not on file  Physical Activity: Not on file  Stress: Not on file  Social Connections: Not on file    Allergies  Allergen Reactions   Codeine Itching     Current Outpatient Medications:    bacitracin ointment, Apply 1 application topically 2 (two) times daily. (Patient not taking: Reported on 11/27/2020), Disp: 120 g, Rfl: 0   cephALEXin (KEFLEX) 500 MG capsule, Take 1 capsule (500 mg total) by mouth 4 (four) times daily. (Patient not taking: Reported on 03/02/2021), Disp: 28 capsule, Rfl: 0   Diclofenac Sodium CR 100 MG 24 hr tablet, Take 1 tablet (100 mg total) by mouth daily. (Patient not taking: Reported on 11/27/2020), Disp: 10 tablet, Rfl: 0   doravirine (PIFELTRO) 100 MG TABS tablet, Take 1 tablet (100 mg total) by mouth  daily., Disp: 30 tablet, Rfl: 11   doxycycline (VIBRAMYCIN) 100 MG capsule, Take 1 capsule (100 mg total) by mouth 2 (two) times daily. One po bid x 7 days (Patient not taking: Reported on 03/02/2021), Disp: 14 capsule, Rfl: 0   emtricitabine-tenofovir AF (DESCOVY) 200-25 MG tablet, Take 1 tablet by mouth daily., Disp: 30 tablet, Rfl: 11   imiquimod (ALDARA) 5 % cream, Apply topically 3 (three) times a week. To lesion on hand (Patient not taking: Reported on 02/21/2022), Disp: 12 each, Rfl: 2   Pitavastatin Calcium 4 MG TABS, Take 1 tablet (4 mg total) by mouth daily., Disp: 30 tablet, Rfl: 11   sodium fluoride (PREVIDENT 5000 PLUS) 1.1 % CREA dental cream, Brush on teeth with a toothbrush after evening mouth care. Spit out excess and do NOT rinse. (Patient not taking: Reported on 11/27/2020), Disp: 51 g, Rfl: 3     Review of Systems  Constitutional:  Positive for appetite change and unexpected weight change. Negative for activity change, chills, diaphoresis, fatigue and fever.  HENT:  Negative for congestion, rhinorrhea, sinus pressure, sneezing, sore throat and trouble swallowing.   Eyes:  Negative for photophobia and visual disturbance.  Respiratory:  Negative for cough, chest tightness, shortness of breath, wheezing and stridor.   Cardiovascular:  Negative for chest pain, palpitations and leg swelling.  Gastrointestinal:  Positive for constipation. Negative for abdominal distention, abdominal pain, anal bleeding, blood in stool, diarrhea, nausea and vomiting.  Genitourinary:  Positive for difficulty urinating. Negative for dysuria, flank pain and hematuria.  Musculoskeletal:  Negative for arthralgias, back pain, gait problem, joint swelling and myalgias.  Skin:  Negative for color change, pallor, rash and wound.  Neurological:  Negative for dizziness, tremors, weakness and light-headedness.  Hematological:  Negative for adenopathy. Does not bruise/bleed easily.  Psychiatric/Behavioral:   Negative for agitation, behavioral problems, confusion, decreased concentration, dysphoric mood and sleep disturbance.        Objective:   Physical Exam Exam conducted with a chaperone present.  Constitutional:      Appearance: He is well-developed.  HENT:     Head: Normocephalic and atraumatic.  Eyes:     Conjunctiva/sclera: Conjunctivae normal.  Cardiovascular:     Rate and Rhythm: Normal rate and regular rhythm.  Pulmonary:     Effort: Pulmonary effort is normal. No respiratory distress.     Breath sounds: No wheezing.  Abdominal:     General: There is no distension.     Palpations: Abdomen is soft.  Genitourinary:    Prostate: Enlarged. Not tender and no nodules present.     Rectum: Normal.  Musculoskeletal:        General: No tenderness. Normal range of motion.     Cervical back: Normal  range of motion and neck supple.  Skin:    General: Skin is warm and dry.     Coloration: Skin is not pale.     Findings: No erythema or rash.  Neurological:     General: No focal deficit present.     Mental Status: He is alert and oriented to person, place, and time.  Psychiatric:        Mood and Affect: Mood normal.        Behavior: Behavior normal.        Thought Content: Thought content normal.        Judgment: Judgment normal.        Assessment & Plan:    HIV disease:  I have reviewed [facility] labs including viral load which was  Lab Results  Component Value Date   HIV1RNAQUANT <20 (H) 08/03/2022   and cd4 which was  Lab Results  Component Value Date   CD4TABS 272 (L) 08/03/2022     I am continuing patient's prescription for Pifeltro and Descovy   Hyperlipidemia: continue pitavastatin  Difficulty voiding:  Will check PSA.  Check urine culture and likely give him an empiric course of therapy for prostatitis.  Also screen for gonorrhea and chlamydia in the rectum and oropharynx.  Involuntary weight loss:  Hopefully is due to this recent and hopefully  acute problem and not declaration of chronic issues such as malignancy.  I am referring to [facility] for HRA given his hx of LGSIL  Will refer to GI for colonoscopy    Constipation: recommended fiber, fluids and can trial miralax  .I have personally spent 40 minutes involved in face-to-face and non-face-to-face activities for this patient on the day of the visit. Professional time spent includes the following activities: Preparing to see the patient (review of tests), Obtaining and/or reviewing separately obtained history (admission/discharge record), Performing a medically appropriate examination and/or evaluation , Ordering medications/tests/procedures, referring and communicating with other health care professionals, Documenting clinical information in the EMR, Independently interpreting results (not separately reported), Communicating results to the patient/family/caregiver, Counseling and educating the patient/family/caregiver and Care coordination (not separately reported).

## 2022-08-16 LAB — COMPLETE METABOLIC PANEL WITH GFR
AG Ratio: 1.6 (calc) (ref 1.0–2.5)
ALT: 16 U/L (ref 9–46)
AST: 15 U/L (ref 10–35)
Albumin: 4.2 g/dL (ref 3.6–5.1)
Alkaline phosphatase (APISO): 63 U/L (ref 35–144)
BUN: 13 mg/dL (ref 7–25)
CO2: 24 mmol/L (ref 20–32)
Calcium: 9.4 mg/dL (ref 8.6–10.3)
Chloride: 106 mmol/L (ref 98–110)
Creat: 0.78 mg/dL (ref 0.70–1.30)
Globulin: 2.7 g/dL (calc) (ref 1.9–3.7)
Glucose, Bld: 107 mg/dL — ABNORMAL HIGH (ref 65–99)
Potassium: 4.2 mmol/L (ref 3.5–5.3)
Sodium: 140 mmol/L (ref 135–146)
Total Bilirubin: 0.3 mg/dL (ref 0.2–1.2)
Total Protein: 6.9 g/dL (ref 6.1–8.1)
eGFR: 104 mL/min/{1.73_m2} (ref >=60)

## 2022-08-16 LAB — URINALYSIS, ROUTINE W REFLEX MICROSCOPIC
Bilirubin Urine: NEGATIVE
Glucose, UA: NEGATIVE
Hgb urine dipstick: NEGATIVE
Ketones, ur: NEGATIVE
Leukocytes,Ua: NEGATIVE
Nitrite: NEGATIVE
Protein, ur: NEGATIVE
Specific Gravity, Urine: 1.01 (ref 1.001–1.035)
pH: 6 (ref 5.0–8.0)

## 2022-08-16 LAB — CYTOLOGY, (ORAL, ANAL, URETHRAL) ANCILLARY ONLY
Chlamydia: NEGATIVE
Chlamydia: NEGATIVE
Comment: NEGATIVE
Comment: NEGATIVE
Comment: NORMAL
Comment: NORMAL
Neisseria Gonorrhea: NEGATIVE
Neisseria Gonorrhea: NEGATIVE

## 2022-08-16 LAB — URINE CULTURE
MICRO NUMBER:: 14978931
Result:: NO GROWTH
SPECIMEN QUALITY:: ADEQUATE

## 2022-08-16 LAB — PSA: PSA: 1.13 ng/mL (ref <=4.00)

## 2022-08-17 ENCOUNTER — Telehealth: Payer: Self-pay

## 2022-08-17 DIAGNOSIS — R39198 Other difficulties with micturition: Secondary | ICD-10-CM

## 2022-08-17 NOTE — Telephone Encounter (Signed)
Called patient to notify him of new prescription and confirm pharmacy, no answer. Left HIPAA compliant voicemail requesting callback.   Sandie Ano, RN

## 2022-08-17 NOTE — Telephone Encounter (Signed)
-----   Message from Randall Hiss, MD sent at 08/17/2022 11:48 AM EDT ----- Can we have him take levaquin 750mg  x a month to see if his symptoms of difficulty voiding etc get better? ----- Message ----- From: Janace Hoard Lab Results In Sent: 08/16/2022  12:32 AM EDT To: Randall Hiss, MD

## 2022-08-18 MED ORDER — LEVOFLOXACIN 750 MG PO TABS
750.0000 mg | ORAL_TABLET | Freq: Every day | ORAL | 0 refills | Status: DC
Start: 2022-08-18 — End: 2022-11-30

## 2022-08-18 NOTE — Addendum Note (Signed)
Addended by: Linna Hoff D on: 08/18/2022 09:07 AM   Modules accepted: Orders

## 2022-09-15 ENCOUNTER — Ambulatory Visit (INDEPENDENT_AMBULATORY_CARE_PROVIDER_SITE_OTHER): Payer: Self-pay | Admitting: Infectious Disease

## 2022-09-15 ENCOUNTER — Encounter: Payer: Self-pay | Admitting: Infectious Disease

## 2022-09-15 ENCOUNTER — Other Ambulatory Visit: Payer: Self-pay

## 2022-09-15 VITALS — BP 108/68 | HR 69 | Temp 97.4°F | Resp 16 | Wt 171.0 lb

## 2022-09-15 DIAGNOSIS — B20 Human immunodeficiency virus [HIV] disease: Secondary | ICD-10-CM

## 2022-09-15 DIAGNOSIS — R87612 Low grade squamous intraepithelial lesion on cytologic smear of cervix (LGSIL): Secondary | ICD-10-CM

## 2022-09-15 DIAGNOSIS — R85612 Low grade squamous intraepithelial lesion on cytologic smear of anus (LGSIL): Secondary | ICD-10-CM

## 2022-09-15 DIAGNOSIS — R634 Abnormal weight loss: Secondary | ICD-10-CM

## 2022-09-15 DIAGNOSIS — E785 Hyperlipidemia, unspecified: Secondary | ICD-10-CM

## 2022-09-15 DIAGNOSIS — K5900 Constipation, unspecified: Secondary | ICD-10-CM

## 2022-09-15 DIAGNOSIS — R39198 Other difficulties with micturition: Secondary | ICD-10-CM

## 2022-09-15 HISTORY — DX: Human immunodeficiency virus (HIV) disease: B20

## 2022-09-15 HISTORY — DX: Abnormal weight loss: R63.4

## 2022-09-15 NOTE — Progress Notes (Signed)
Subjective:   Chief complaint : Follow-up for HIV disease on medications  Patient ID: Jason [name], male    DOB: 20-Feb-1966, 57 y.o.   MRN: [id number]  HPI  Jason [name] is a 57 year-old Caucasian man who lives with HIV that is been perfectly controlled on Biktarvy --> Pifeltro and Descovy.   Past fall when I saw Laquann his partner had been diagnosed with neurosyphilis and he was concerned about having syphilis.  We tested him for syphilis his RPR was low at 1-2 but we given also benzathine penicillin at the same visit 2,400,000 units.  Titers have remained unchanged in the interim.  He says that his partner is improving cognition but still with some deficits.  My last saw him he is suffering from some constipation as well as difficulty emptying his bladder.  We put him on antibiotics empirically for prostatitis but he did not tolerate them well and stopped them after 3 days due to GI upset.  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   Malar rash 01/18/2017   Olecranon bursitis 03/02/2021   Osteoarthritis of left knee 10/02/2019   Right knee pain 10/11/2017   Scrotal ulcer 12/05/2018   Septic olecranon bursitis, left 11/27/2020   Syphilis 08/15/2022   Thrush 03/03/2016   Vaccine counseling 04/27/2021   Vivid dream 04/28/2021   Wart of hand 07/29/2021    Weight gain 05/21/2019   Weight gain 05/21/2019    Past Surgical History:  Procedure Laterality Date   ESOPHAGOGASTRODUODENOSCOPY (EGD) WITH PROPOFOL N/A 09/10/2016   Procedure: ESOPHAGOGASTRODUODENOSCOPY (EGD) WITH PROPOFOL;  Surgeon: Vida Rigger, MD;  Location: [facility] ENDOSCOPY;  Service: Endoscopy;  Laterality: N/A;    Family History  Problem Relation Age of Onset   Kidney disease Father       Social History   Socioeconomic History   Marital status: Single    Spouse name: Not on file   Number of children: Not on file   Years of education: Not on file   Highest education level: Not on file  Occupational History   Not on file  Tobacco Use   Smoking status: Never   Smokeless tobacco: Never  Vaping Use   Vaping Use: Never used  Substance and Sexual Activity   Alcohol use: Yes    Comment: rare   Drug use: Yes    Types: Marijuana   Sexual activity: Not Currently    Partners: Male    Comment: declined condoms  Other Topics Concern   Not on file  Social History Narrative   Not on file   Social Determinants of Health   Financial Resource Strain: Not on file  Food Insecurity: Not on file  Transportation Needs: Not on file  Physical Activity: Not on file  Stress: Not on file  Social Connections: Not on file    Allergies  Allergen Reactions   Codeine Itching     Current Outpatient Medications:    doravirine (PIFELTRO) 100 MG TABS tablet, Take 1 tablet (100 mg total) by mouth daily., Disp: 30 tablet, Rfl: 11   emtricitabine-tenofovir AF (DESCOVY) 200-25 MG tablet, Take 1 tablet by mouth daily., Disp: 30 tablet, Rfl: 11   Pitavastatin Calcium 4 MG TABS, Take 1 tablet (4 mg total) by mouth daily., Disp: 30 tablet, Rfl: 11   bacitracin ointment, Apply 1 application topically 2 (two) times daily. (Patient not taking: Reported on 11/27/2020), Disp: 120 g, Rfl: 0   cephALEXin (KEFLEX) 500 MG capsule, Take 1 capsule (500 mg total) by mouth 4 (four) times daily. (Patient not  taking: Reported on 03/02/2021), Disp: 28 capsule, Rfl: 0   Diclofenac Sodium CR 100 MG 24 hr tablet, Take 1 tablet (100 mg total) by mouth daily. (Patient not taking: Reported on 11/27/2020), Disp: 10 tablet, Rfl: 0   doxycycline (VIBRAMYCIN) 100 MG capsule, Take 1 capsule (100 mg total) by mouth 2 (two) times daily. One po bid x 7 days (Patient not taking: Reported on 03/02/2021), Disp: 14 capsule, Rfl: 0   imiquimod (ALDARA) 5 % cream, Apply topically 3 (three) times a week. To lesion on hand (Patient not taking: Reported on 02/21/2022), Disp: 12 each, Rfl: 2   levofloxacin (LEVAQUIN) 750 MG tablet, Take 1 tablet (750 mg total) by mouth daily. (Patient not taking: Reported on 09/15/2022), Disp: 30 tablet, Rfl: 0   sodium fluoride (PREVIDENT 5000 PLUS) 1.1 % CREA dental cream, Brush on teeth with a toothbrush after evening mouth care. Spit out excess and do NOT rinse. (Patient not taking: Reported on 11/27/2020), Disp: 51 g, Rfl: 3     Review of Systems  Constitutional:  Negative for activity change, appetite change, chills, diaphoresis, fatigue, fever and unexpected weight change.  HENT:  Negative for congestion, rhinorrhea, sinus pressure, sneezing, sore throat and trouble swallowing.   Eyes:  Negative for photophobia and visual disturbance.  Respiratory:  Negative for cough, chest tightness, shortness of breath, wheezing and stridor.   Cardiovascular:  Negative for chest pain, palpitations and leg swelling.  Gastrointestinal:  Negative for abdominal distention, abdominal pain, anal bleeding, blood in stool, constipation, diarrhea, nausea and vomiting.  Genitourinary:  Negative for difficulty urinating, dysuria, flank pain and hematuria.  Musculoskeletal:  Negative for arthralgias, back pain, gait problem, joint swelling and myalgias.  Skin:  Negative for color change, pallor, rash and wound.  Neurological:  Negative for dizziness, tremors, weakness and light-headedness.  Hematological:  Negative  for adenopathy. Does not bruise/bleed easily.  Psychiatric/Behavioral:  Negative for agitation, behavioral problems, confusion, decreased concentration, dysphoric mood and sleep disturbance.        Objective:   Physical Exam Constitutional:      Appearance: He is well-developed.  HENT:     Head: Normocephalic and atraumatic.  Eyes:     Conjunctiva/sclera: Conjunctivae normal.  Cardiovascular:     Rate and Rhythm: Normal rate and regular rhythm.  Pulmonary:     Effort: Pulmonary effort is normal. No respiratory distress.     Breath sounds: No wheezing.  Abdominal:     General: There is no distension.     Palpations: Abdomen is soft.  Musculoskeletal:        General: No tenderness. Normal range of motion.     Cervical back: Normal range of motion and neck supple.  Skin:    General: Skin is warm and dry.  Coloration: Skin is not pale.     Findings: No erythema or rash.  Neurological:     General: No focal deficit present.     Mental Status: He is alert and oriented to person, place, and time.  Psychiatric:        Mood and Affect: Mood normal.        Behavior: Behavior normal.        Thought Content: Thought content normal.        Judgment: Judgment normal.        Assessment & Plan:    HIV disease:  I have reviewed Lorence Stimpson's labs including viral load which was  Lab Results  Component Value Date   HIV1RNAQUANT <20 (H) 08/03/2022   and cd4 which was  Lab Results  Component Value Date   CD4TABS 272 (L) 08/03/2022     I am continuing patient's prescription for phenytoin DESCOVY  Hyperlipidemia we will continue Pitavastatin.  Difficulty voiding: This is resolved  Weight loss: I put in for referral for colonoscopy weight has also stablized in meantime  LGSIL: referral to [facility] for anoscopy    I am referring to [facility] for HRA given his hx of LGSIL

## 2022-11-14 ENCOUNTER — Encounter: Payer: Self-pay | Admitting: Family Medicine

## 2022-11-14 ENCOUNTER — Encounter: Payer: Self-pay | Admitting: Infectious Disease

## 2022-11-29 ENCOUNTER — Other Ambulatory Visit: Payer: Self-pay

## 2022-11-29 DIAGNOSIS — M791 Myalgia, unspecified site: Secondary | ICD-10-CM

## 2022-11-29 DIAGNOSIS — Z79899 Other long term (current) drug therapy: Secondary | ICD-10-CM

## 2022-11-30 ENCOUNTER — Ambulatory Visit: Payer: BLUE CROSS/BLUE SHIELD | Attending: Physician Assistant | Admitting: Physician Assistant

## 2022-11-30 ENCOUNTER — Other Ambulatory Visit: Payer: Self-pay

## 2022-11-30 ENCOUNTER — Ambulatory Visit: Payer: BLUE CROSS/BLUE SHIELD | Admitting: Physician Assistant

## 2022-11-30 ENCOUNTER — Encounter: Payer: Self-pay | Admitting: Physician Assistant

## 2022-11-30 ENCOUNTER — Other Ambulatory Visit: Payer: BLUE CROSS/BLUE SHIELD

## 2022-11-30 VITALS — BP 143/89 | HR 59 | Wt 174.4 lb

## 2022-11-30 DIAGNOSIS — R21 Rash and other nonspecific skin eruption: Secondary | ICD-10-CM | POA: Diagnosis not present

## 2022-11-30 DIAGNOSIS — S336XXA Sprain of sacroiliac joint, initial encounter: Secondary | ICD-10-CM | POA: Diagnosis not present

## 2022-11-30 DIAGNOSIS — Z79899 Other long term (current) drug therapy: Secondary | ICD-10-CM

## 2022-11-30 DIAGNOSIS — M791 Myalgia, unspecified site: Secondary | ICD-10-CM

## 2022-11-30 DIAGNOSIS — M24541 Contracture, right hand: Secondary | ICD-10-CM | POA: Diagnosis not present

## 2022-11-30 MED ORDER — CYCLOBENZAPRINE HCL 5 MG PO TABS
ORAL_TABLET | ORAL | 1 refills | Status: DC
Start: 2022-11-30 — End: 2023-11-29

## 2022-11-30 MED ORDER — CETIRIZINE HCL 10 MG PO TABS
10.0000 mg | ORAL_TABLET | Freq: Every day | ORAL | 11 refills | Status: DC
Start: 2022-11-30 — End: 2023-11-29

## 2022-11-30 MED ORDER — NAPROXEN 500 MG PO TABS: 500.0000 mg | ORAL_TABLET | Freq: Two times a day (BID) | ORAL | 1 refills | Status: DC

## 2022-11-30 NOTE — Progress Notes (Signed)
Patient ID: Jason [name], male   DOB: 05-28-1965, 57 y.o.   MRN: [id number]   Jason [name], is a 57 y.o. male  [id number]  [id number]  DOB - 1965/10/07  Chief Complaint  Patient presents with   Hip Pain   Leg Pain   hand swelling    Patient stated to having hand stiffness as well        Subjective:   Jason [name] is a 57 y.o. male here today for pain in L S-I area and in L thigh that has been going on for about 2 months.  NKI.  It is worse when he lays down to sleep at night.  The pain does not radiate.  It hurts if he tries to lay on his L side.  No urinary s/sx.  OTC advil helps if he takes 5 tabs.    4th digit of R hand gets stuck while it's bent sometimes.  This has been going on a few months  Yesterday his L index PIP swelled but has since gone down.  No fevers.  No tick bites.    Itching on forearms.  He thinks something in his place is biting him   No problems updated.  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 Authorizing Provider  cetirizine (ZYRTEC) 10 MG tablet Take 1 tablet (10 mg total) by mouth daily. Prn itching 11/30/22  Yes Anders Simmonds, PA-C  cyclobenzaprine (FLEXERIL) 5 MG tablet Take 1 to 2 tabs up to 3 times daily for muscle spasm 11/30/22  Yes Georgian Co M, PA-C  naproxen (NAPROSYN) 500 MG tablet Take 1 tablet (500 mg total) by mouth 2 (two) times daily with a meal. Prn pain 11/30/22  Yes Kaito Schulenburg M, PA-C  doravirine (PIFELTRO) 100 MG TABS tablet Take 1 tablet (100 mg total) by mouth daily. 08/15/22   Randall Hiss, MD  emtricitabine-tenofovir AF (DESCOVY) 200-25 MG tablet Take 1 tablet by mouth daily. 08/15/22   Randall Hiss, MD  imiquimod Mathis Dad) 5 % cream Apply topically 3 (three) times a week. To lesion on hand Patient not taking: Reported on 02/21/2022 07/30/21   Daiva Eves, Lisette Grinder, MD  Pitavastatin Calcium 4 MG TABS Take 1 tablet (4 mg total) by mouth daily. 08/15/22   Randall Hiss, MD  sodium fluoride (PREVIDENT 5000 PLUS) 1.1 % CREA dental cream Brush  on teeth with a toothbrush after evening mouth care. Spit out excess and do NOT rinse. Patient not taking: Reported on 11/27/2020 07/14/20   Shim, Emelda Fear H, DMD    ROS: Neg HEENT Neg resp Neg cardiac Neg GI Neg GU Neg psych Neg neuro  Objective:   Vitals:   11/30/22 1133  BP: (!) 143/89  Pulse: (!) 59  SpO2: 96%  Weight: 174 lb 6.4 oz (79.1 kg)   Exam General appearance : Awake, alert, not in any distress. Speech Clear. Not toxic looking HEENT: Atraumatic and Normocephalic  Neck: Supple, no JVD. No cervical lymphadenopathy.  Chest: Good air entry bilaterally, CTAB.  No rales/rhonchi/wheezing CVS: S1 S2 regular, no murmurs.  R 4th finger is currently normal and ROM is ok.  L index finger is normal TTP in L S-I joint that reproduces pain into L thigh.  External rotation of hip is normal.   Internal rotation is normal but increases pain Extremities: B/L Lower Ext shows no edema, both legs are warm to touch Neurology: Awake alert, and oriented X 3, CN II-XII intact, Non focal Skin: a few insect bites on B forearm with no pattern   Data Review Lab Results  Component Value Date   HGBA1C 5.3 08/07/2017    Assessment & Plan   1. Sprain of sacroiliac ligament, initial encounter -naproxen 500mg  bid - cyclobenzaprine (FLEXERIL) 5 MG tablet; Take 1 to 2 tabs up to 3 times daily for muscle spasm  Dispense: 60 tablet; Refill: 1  2. Rash - cetirizine (ZYRTEC) 10 MG tablet; Take 1 tablet (10 mg total) by mouth daily. Prn itching  Dispense: 30 tablet; Refill: 11  3.  Likely early Dupuytren's contracture-currently has [facility] and will try and see a specialist there   Return if symptoms worsen or fail to improve.  The patient was given clear instructions to go to ER or return to medical center if symptoms don't improve, worsen or new problems develop. The patient verbalized understanding. The patient was told to call to get lab results if they haven't heard anything in the next week.      Georgian Co, PA-C [facility] [place], [place] [phone]   11/30/2022, 12:25 PM

## 2022-11-30 NOTE — Patient Instructions (Addendum)
Dupuytren's Contracture Dupuytren's contracture can be a hard condition to deal with. Your health care provider and those close to you can help you manage it. When you have this condition, the tissue under the skin of your palm gets thick. This causes one or more of your fingers to curl inward (contract) toward the palm. After a while, the fingers may not be able to straighten out. This condition can affect some or all of your fingers and the palms of both hands. Dupuytren's contracture is a long-term (chronic) condition that develops (progresses) slowly over time. While there is no cure yet, symptoms can be managed and treatment can slow it down. This condition is usually not dangerous but it can interfere with your everyday tasks. What are the causes?  This condition is caused by tissue (fascia) in your palm that gets thicker and tighter. When the tissue thickens, it pulls on the cords of your tissue (tendons) that control finger movement. This causes the fingers to contract. The cause of your tissue getting thick is not known. However, the condition is often passed along from parent to child (inherited). What increases the risk? Being 44 years of age or older. Being male. Having a family history of this condition. Using tobacco products, including cigarettes, chewing tobacco, and e-cigarettes. Drinking too much alcohol. Having diabetes. Having a seizure disorder. What are the signs or symptoms? Early symptoms of this condition may include: Thick, wrinkled skin on the hand. One or more lumps (nodules) on the palm. Lumps may sometimes feel tender or painful. Later symptoms of this condition may include: Thick cords of tissue in the palm. Fingers curled up toward the palm. Inability to straighten the fingers into their usual position. Discomfort when holding or grabbing objects. How is this diagnosed? This condition is diagnosed with a physical exam. This may include: Looking at your hands  and feeling your palms. This is to check for thickened tissue and lumps. Measuring finger motion. Doing the [place] tabletop test. This is where you may try to put your hand on a surface, with your palm down and your fingers straight out. How is this treated? There is no cure for this condition, but treatment can relieve your discomfort and make symptoms more manageable. Treatment options may include: Physical therapy. This can strengthen your hand and increase flexibility. Occupational therapy. This can help you with everyday tasks that may have become more difficult. Shots (injections). Substances may be injected into your hand, such as: Medicines that help to decrease swelling and pain (corticosteroids). Enzymes (collagenase) to weaken thick tissue. After a collagenase injection, your provider may stretch your fingers. Needle aponeurotomy. A needle is pushed through the skin and into the tissue. Moving the needle against the tissues can weaken or break up the thick tissue. Surgery. This may be needed if your condition causes discomfort or interferes with everyday activities. Physical therapy is usually needed after surgery. No treatment is guaranteed to cure this condition. It is common for the condition to come back. Follow these instructions at home: Hand care Take these actions to help protect your hand from possible injury: Use tools that have padded grips. Wear protective gloves while you work with your hands. Avoid repeated hand movements. General instructions Take over-the-counter and prescription medicines only as told by your provider. Manage any other conditions that you have, such as diabetes. If physical therapy was prescribed, do exercises as told by your provider. Do not use any products that contain nicotine or tobacco. These products  include cigarettes, chewing tobacco, and vaping devices, such as e-cigarettes. If you need help quitting, ask your provider. If you drink  alcohol: Limit how much you have to: 0-1 drink a day if you are male. 0-2 drinks a day if you are male. Know how much alcohol is in your drink. In the [place] one drink is one 12 oz bottle of beer (355 mL), one 5 oz glass of wine (148 mL), or one 1 oz glass of hard liquor (44 mL). Keep all follow-up visits. Your provider will check to see if your treatments need adjusting. Where to find support Dupuytren [employer]: dupuytrens.org International Dupuytren Society: dupuytren-online.info Contact a health care provider if: You develop new symptoms or your symptoms get worse. You have pain that gets worse or does not get better with medicine. You have difficulty or discomfort with everyday tasks. You develop numbness or tingling. Get help right away if: You have severe pain. Your fingers change color or become unusually cold. This information is not intended to replace advice given to you by your health care provider. Make sure you discuss any questions you have with your health care provider. Document Revised: 04/04/2022 Document Reviewed: 02/23/2022 Elsevier Patient Education  2024 Elsevier Inc. Sacroiliac Joint Dysfunction  Sacroiliac joint dysfunction is a condition that causes inflammation on one or both sides of the sacroiliac (SI) joint. The SI joint is the joint between two bones of the pelvis called the sacrum and the ilium. The sacrum is the bone at the base of the spine. The ilium is the large bone that forms the hip. This condition causes deep aching or burning pain in the low back. In some cases, the pain may also spread into one or both buttocks, hips, or thighs. What are the causes? This condition may be caused by: Pregnancy. During pregnancy, extra stress is put on the SI joints because the pelvis widens. Injury, such as: Injuries from car crashes. Sports-related injuries. Work-related injuries. Having one leg that is shorter than the other. Conditions that affect the  joints, such as: Rheumatoid arthritis. Gout. Psoriatic arthritis. Joint infection (septic arthritis). Sometimes, the cause of SI joint dysfunction is not known. What are the signs or symptoms? Symptoms of this condition include: Aching or burning pain in the lower back. The pain may also spread to other areas, such as: Buttocks. Groin. Thighs. Muscle spasms in or around the painful areas. Increased pain when standing, walking, running, stair climbing, bending, or lifting. How is this diagnosed? This condition is diagnosed with a physical exam and your medical history. During the exam, the health care provider may move one or both of your legs to different positions to check for pain. Various tests may be done to confirm the diagnosis, including: Imaging tests to look for other causes of pain. These may include: MRI. CT scan. Bone scan. Diagnostic injection. A numbing medicine is injected into the SI joint using a needle. If your pain is temporarily improved or stopped after the injection, this can indicate that SI joint dysfunction is the problem. How is this treated? Treatment depends on the cause and severity of your condition. Treatment options can be noninvasive and may include: Ice or heat applied to the lower back area after an injury. This may help reduce pain and muscle spasms. Medicines to relieve pain or inflammation or to relax the muscles. Wearing a back brace (sacroiliac brace) to help support the joint while your back is healing. Physical therapy to increase muscle strength  around the joint and flexibility at the joint. This may also involve learning proper body positions and ways of moving to relieve stress on the joint. Direct manipulation of the SI joint. Use of a device that provides electrical stimulation to help reduce pain at the joint. Other treatments may include: Injections of steroid medicine into the joint to reduce pain and swelling. Radiofrequency ablation.  This treatment uses heat to burn away nerves that are carrying pain messages from the joint. Surgery to put in screws and plates that limit or prevent joint motion. This is rare. Follow these instructions at home: Medicines Take over-the-counter and prescription medicines only as told by your health care provider. Ask your health care provider if the medicine prescribed to you: Requires you to avoid driving or using machinery. Can cause constipation. You may need to take these actions to prevent or treat constipation: Drink enough fluid to keep your urine pale yellow. Take over-the-counter or prescription medicines. Eat foods that are high in fiber, such as beans, whole grains, and fresh fruits and vegetables. Limit foods that are high in fat and processed sugars, such as fried or sweet foods. If you have a brace: Wear the brace as told by your health care provider. Remove it only as told by your health care provider. Keep the brace clean. If the brace is not waterproof: Do not let it get wet. Cover it with a watertight covering when you take a bath or a shower. Managing pain, stiffness, and swelling     Icing can help with pain and swelling. Heat may help with muscle tension or spasms. Ask your health care provider if you should use ice or heat. If directed, put ice on the affected area: If you have a removable brace, remove it as told by your health care provider. Put ice in a plastic bag. Place a towel between your skin and the bag. Leave the ice on for 20 minutes, 2-3 times a day. Remove the ice if your skin turns bright red. This is very important. If you cannot feel pain, heat, or cold, you have a greater risk of damage to the area. If directed, apply heat to the affected area as often as told by your health care provider. Use the heat source that your health care provider recommends, such as a moist heat pack or a heating pad. Place a towel between your skin and the heat  source. Leave the heat on for 20-30 minutes. Remove the heat if your skin turns bright red. This is especially important if you are unable to feel pain, heat, or cold. You may have a greater risk of getting burned. General instructions Rest as needed. Return to your normal activities as told by your health care provider. Ask your health care provider what activities are safe for you. Do exercises as told by your health care provider or physical therapist. Keep all follow-up visits. This is important. Contact a health care provider if: Your pain is not controlled with medicine. You have a fever. Your pain is getting worse. Get help right away if: You have weakness, numbness, or tingling in your legs or feet. You lose control of your bladder or bowels. Summary Sacroiliac (SI) joint dysfunction is a condition that causes inflammation on one or both sides of the SI joint. This condition causes deep aching or burning pain in the low back. In some cases, the pain may also spread into one or both buttocks, hips, or thighs. Treatment depends on  the cause and severity of your condition. It may include medicines to reduce pain and swelling or to relax muscles. This information is not intended to replace advice given to you by your health care provider. Make sure you discuss any questions you have with your health care provider. Document Revised: 07/25/2019 Document Reviewed: 07/25/2019 Elsevier Patient Education  2024 [employer].

## 2022-12-01 LAB — CK: Total CK: 126 U/L (ref 44–196)

## 2022-12-02 ENCOUNTER — Telehealth: Payer: Self-pay

## 2022-12-02 NOTE — Telephone Encounter (Signed)
Called [place], no answer. Left HIPAA compliant voicemail requesting callback.   Sandie Ano, RN

## 2022-12-02 NOTE — Telephone Encounter (Signed)
-----   Message from [place] sent at 12/01/2022  2:16 PM EDT ----- Regarding: cpk is completely normal was he the one who thought statin causing side effects?  ----- Message ----- From: Janace Hoard Lab Results In Sent: 12/01/2022  12:59 AM EDT To: Randall Hiss, MD

## 2023-02-28 NOTE — Progress Notes (Unsigned)
Subjective:    Patient ID: Jason [name], male    DOB: April 28, 1965, 57 y.o.   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   Bloating symptom 10/11/2017   Constipation 08/15/2022   Depression 01/18/2016   Difficulty voiding 08/15/2022   Dysphagia 07/19/2016   Facial rash 03/03/2016   Hepatitis C    HIV disease (HCC) 09/15/2022   HIV positive (HCC)    Inguinal hernia 01/18/2017   Left elbow pain 08/15/2018   Low grade squamous intraepithelial lesion on cytologic smear of anus (LGSIL) 09/01/2020   Malar rash 01/18/2017   Olecranon bursitis 03/02/2021   Osteoarthritis of left knee 10/02/2019   Right knee pain 10/11/2017   Scrotal ulcer 12/05/2018   Septic olecranon bursitis, left 11/27/2020   Syphilis 08/15/2022   Thrush 03/03/2016   Vaccine counseling 04/27/2021   Vivid dream 04/28/2021   Wart of hand 07/29/2021   Weight gain 05/21/2019   Weight gain 05/21/2019   Weight loss 09/15/2022    Past Surgical History:  Procedure Laterality Date   ESOPHAGOGASTRODUODENOSCOPY (EGD) WITH PROPOFOL N/A 09/10/2016   Procedure: ESOPHAGOGASTRODUODENOSCOPY (EGD) WITH PROPOFOL;  Surgeon: Vida Rigger, MD;  Location: [facility] ENDOSCOPY;  Service: Endoscopy;  Laterality: N/A;    Family History  Problem Relation Age of Onset   Kidney disease Father       Social History   Socioeconomic History   Marital status: Single    Spouse name: Not on file   Number of children: Not on file   Years of education: Not on file   Highest education level: Not on file  Occupational History   Not on file  Tobacco Use   Smoking status: Never   Smokeless tobacco: Never  Vaping Use   Vaping status: Never Used  Substance and Sexual Activity   Alcohol use: Yes    Comment: rare   Drug use: Yes    Types: Marijuana   Sexual activity: Not Currently    Partners: Male    Comment: declined condoms  Other  Topics Concern   Not on file  Social History Narrative   Not on file   Social Determinants of Health   Financial Resource Strain: Not on file  Food Insecurity: Not on file  Transportation Needs: Not on file  Physical Activity: Not on file  Stress: Not on file  Social Connections: Not on file    Allergies  Allergen Reactions   Codeine Itching     Current Outpatient Medications:    cetirizine (ZYRTEC) 10 MG tablet, Take 1 tablet (10 mg total) by mouth daily. Prn itching, Disp: 30 tablet, Rfl: 11   cyclobenzaprine (FLEXERIL) 5 MG tablet, Take 1 to 2 tabs up to 3 times daily for muscle spasm, Disp: 60 tablet, Rfl: 1   doravirine (PIFELTRO) 100 MG TABS tablet, Take 1 tablet (100 mg total) by mouth daily., Disp: 30 tablet, Rfl: 11   emtricitabine-tenofovir AF (DESCOVY) 200-25 MG tablet, Take 1 tablet by mouth daily., Disp: 30 tablet, Rfl: 11   imiquimod (ALDARA) 5 % cream, Apply topically 3 (three) times a week. To lesion on hand (Patient not taking: Reported on 02/21/2022), Disp: 12 each, Rfl: 2   naproxen (NAPROSYN) 500 MG tablet, Take 1 tablet (500 mg total) by mouth 2 (two) times daily with a meal. Prn pain, Disp: 60 tablet, Rfl: 1   Pitavastatin Calcium 4 MG TABS,  Take 1 tablet (4 mg total) by mouth daily., Disp: 30 tablet, Rfl: 11   sodium fluoride (PREVIDENT 5000 PLUS) 1.1 % CREA dental cream, Brush on teeth with a toothbrush after evening mouth care. Spit out excess and do NOT rinse. (Patient not taking: Reported on 11/27/2020), Disp: 51 g, Rfl: 3   Review of Systems     Objective:   Physical Exam        Assessment & Plan:

## 2023-03-01 ENCOUNTER — Ambulatory Visit: Payer: Self-pay | Admitting: Infectious Disease

## 2023-03-01 DIAGNOSIS — A630 Anogenital (venereal) warts: Secondary | ICD-10-CM

## 2023-03-01 DIAGNOSIS — R85612 Low grade squamous intraepithelial lesion on cytologic smear of anus (LGSIL): Secondary | ICD-10-CM

## 2023-03-01 DIAGNOSIS — A539 Syphilis, unspecified: Secondary | ICD-10-CM

## 2023-03-01 DIAGNOSIS — Z7185 Encounter for immunization safety counseling: Secondary | ICD-10-CM

## 2023-03-01 DIAGNOSIS — B20 Human immunodeficiency virus [HIV] disease: Secondary | ICD-10-CM

## 2023-03-01 DIAGNOSIS — Z8619 Personal history of other infectious and parasitic diseases: Secondary | ICD-10-CM

## 2023-03-02 ENCOUNTER — Other Ambulatory Visit (HOSPITAL_COMMUNITY): Payer: Self-pay

## 2023-03-02 ENCOUNTER — Telehealth: Payer: Self-pay

## 2023-03-02 NOTE — Telephone Encounter (Signed)
PA approved 03/02/23-03/01/24.  Reference # [id number]   Approval faxed to pharmacy.   Sandie Ano, RN

## 2023-03-02 NOTE — Telephone Encounter (Signed)
PA for Pifeltro submitted via covermymeds. Awaiting response.   Key: [id number]  Sandie Ano, RN

## 2023-03-08 ENCOUNTER — Ambulatory Visit: Payer: Self-pay | Admitting: Infectious Disease

## 2023-03-16 ENCOUNTER — Other Ambulatory Visit: Payer: Self-pay

## 2023-03-16 ENCOUNTER — Telehealth: Payer: Self-pay

## 2023-03-16 ENCOUNTER — Other Ambulatory Visit (HOSPITAL_COMMUNITY): Payer: Self-pay

## 2023-03-16 MED ORDER — PITAVASTATIN CALCIUM 4 MG PO TABS: 4.0000 mg | ORAL_TABLET | Freq: Every day | ORAL | 5 refills | Status: DC

## 2023-03-16 NOTE — Telephone Encounter (Signed)
Patient called office concerned about getting his Dovato and Pitvastatin filled at pharmacy. Was told by [facility] that his insurance [employer] is not showing coverage for either medication. Spoke with Toni Amend, Pharmacy tech at office who is showing coverage for Dovato $0 and Pitvastatin $15.  Can fill both medications at [facility].  Per patient he has a new bottle of Davato at home and would like to hold off changing pharmacies until visit. Would also like to hold off on Pitavastatin until appointment.  During call patient would like to discuss switching pitvastatin to alternative. Is c/o leg pain/ aches. Is concerned pitvastatin is causing it.  Juanita Laster, RMA

## 2023-03-22 ENCOUNTER — Encounter: Payer: Self-pay | Admitting: Infectious Disease

## 2023-03-22 DIAGNOSIS — M791 Myalgia, unspecified site: Secondary | ICD-10-CM | POA: Insufficient documentation

## 2023-03-22 DIAGNOSIS — E785 Hyperlipidemia, unspecified: Secondary | ICD-10-CM

## 2023-03-22 HISTORY — DX: Hyperlipidemia, unspecified: E78.5

## 2023-03-22 HISTORY — DX: Myalgia, unspecified site: M79.10

## 2023-03-23 ENCOUNTER — Encounter: Payer: Self-pay | Admitting: Infectious Disease

## 2023-03-23 ENCOUNTER — Ambulatory Visit (INDEPENDENT_AMBULATORY_CARE_PROVIDER_SITE_OTHER): Payer: BLUE CROSS/BLUE SHIELD | Admitting: Infectious Disease

## 2023-03-23 ENCOUNTER — Other Ambulatory Visit: Payer: Self-pay

## 2023-03-23 VITALS — BP 128/86 | HR 83 | Temp 98.0°F | Ht 68.0 in | Wt 181.0 lb

## 2023-03-23 DIAGNOSIS — Z23 Encounter for immunization: Secondary | ICD-10-CM

## 2023-03-23 DIAGNOSIS — L989 Disorder of the skin and subcutaneous tissue, unspecified: Secondary | ICD-10-CM

## 2023-03-23 DIAGNOSIS — M25551 Pain in right hip: Secondary | ICD-10-CM

## 2023-03-23 DIAGNOSIS — B20 Human immunodeficiency virus [HIV] disease: Secondary | ICD-10-CM

## 2023-03-23 DIAGNOSIS — M25552 Pain in left hip: Secondary | ICD-10-CM

## 2023-03-23 DIAGNOSIS — M79605 Pain in left leg: Secondary | ICD-10-CM

## 2023-03-23 DIAGNOSIS — R85612 Low grade squamous intraepithelial lesion on cytologic smear of anus (LGSIL): Secondary | ICD-10-CM

## 2023-03-23 DIAGNOSIS — E785 Hyperlipidemia, unspecified: Secondary | ICD-10-CM | POA: Diagnosis not present

## 2023-03-23 DIAGNOSIS — G8929 Other chronic pain: Secondary | ICD-10-CM

## 2023-03-23 DIAGNOSIS — M79604 Pain in right leg: Secondary | ICD-10-CM

## 2023-03-23 DIAGNOSIS — Z113 Encounter for screening for infections with a predominantly sexual mode of transmission: Secondary | ICD-10-CM

## 2023-03-23 DIAGNOSIS — Z7185 Encounter for immunization safety counseling: Secondary | ICD-10-CM

## 2023-03-23 DIAGNOSIS — M791 Myalgia, unspecified site: Secondary | ICD-10-CM

## 2023-03-23 MED ORDER — DORAVIRINE 100 MG PO TABS: 100.0000 mg | ORAL_TABLET | Freq: Every day | ORAL | 11 refills | Status: DC

## 2023-03-23 MED ORDER — EMTRICITABINE-TENOFOVIR AF 200-25 MG PO TABS: 1.0000 | ORAL_TABLET | Freq: Every day | ORAL | 11 refills | Status: DC

## 2023-03-23 NOTE — Progress Notes (Signed)
Subjective:  Chief complaint follow-up for HIV disease on medications but also with pain in both legs that starts in the hips and radiates downwards as well as a nonresolving skin lesion on his back   Patient ID: Jason [name], male    DOB: 1965-06-06, 57 y.o.   MRN: [id number]  HPI  Discussed the use of AI scribe software for clinical note transcription with the patient, who gave verbal consent to proceed.  History of Present Illness   The patient, with a history of HIV, presents for routine follow-up. He is currently on Pifeltro and Descovy for HIV management. He was previously on pitavastatin for cholesterol and inflammation control, but has been off it for almost two weeks due to insurance coverage issues.  The patient reports experiencing pain in both legs, which has gradually worsened. The pain is more pronounced when he lays down and eases when he stands up. The pain started in the left leg and has now spread to both legs. He sought care at a community clinic and was prescribed muscle relaxers, which he reports make him excessively drowsy.  In addition to the leg pain, the patient has a persistent bump on his back that has failed to go away over the past couple of months. He denies any history of surgery in that area.  The patient also reports limited flexibility and pain in his hip, which he suspects could be arthritis. He has been experiencing difficulty getting up after sitting down and has lost his ability to run, which he attributes to the hip pain.       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   Bloating symptom 10/11/2017   Constipation 08/15/2022   Depression 01/18/2016   Difficulty voiding 08/15/2022   Dysphagia 07/19/2016   Facial rash 03/03/2016   Hepatitis C    HIV disease (HCC) 09/15/2022   HIV positive (HCC)    Hyperlipidemia 03/22/2023   Inguinal hernia 01/18/2017   Left elbow  pain 08/15/2018   Low grade squamous intraepithelial lesion on cytologic smear of anus (LGSIL) 09/01/2020   Malar rash 01/18/2017   Myalgia 03/22/2023   Olecranon bursitis 03/02/2021   Osteoarthritis of left knee 10/02/2019   Right knee pain 10/11/2017   Scrotal ulcer 12/05/2018   Septic olecranon bursitis, left 11/27/2020   Syphilis 08/15/2022   Thrush 03/03/2016   Vaccine counseling 04/27/2021   Vivid dream 04/28/2021   Wart of hand 07/29/2021   Weight gain 05/21/2019   Weight gain 05/21/2019   Weight loss 09/15/2022    Past Surgical History:  Procedure Laterality Date   ESOPHAGOGASTRODUODENOSCOPY (EGD) WITH PROPOFOL N/A 09/10/2016   Procedure: ESOPHAGOGASTRODUODENOSCOPY (EGD) WITH PROPOFOL;  Surgeon: Vida Rigger, MD;  Location: [facility] ENDOSCOPY;  Service: Endoscopy;  Laterality: N/A;    Family History  Problem Relation Age of Onset   Kidney disease Father       Social History   Socioeconomic History   Marital status: Single    Spouse name: Not on file   Number of children: Not on file   Years of education: Not on file   Highest education level: Not on file  Occupational History   Not on file  Tobacco Use   Smoking status: Never   Smokeless tobacco: Never  Vaping Use   Vaping status: Never Used  Substance and Sexual Activity   Alcohol use: Yes    Comment: rare   Drug  use: Yes    Types: Marijuana   Sexual activity: Not Currently    Partners: Male    Comment: declined condoms  Other Topics Concern   Not on file  Social History Narrative   Not on file   Social Drivers of Health   Financial Resource Strain: Not on file  Food Insecurity: Not on file  Transportation Needs: Not on file  Physical Activity: Not on file  Stress: Not on file  Social Connections: Not on file    Allergies  Allergen Reactions   Codeine Itching     Current Outpatient Medications:    cetirizine (ZYRTEC) 10 MG tablet, Take 1 tablet (10 mg total) by mouth daily. Prn itching  (Patient not taking: Reported on 03/23/2023), Disp: 30 tablet, Rfl: 11   cyclobenzaprine (FLEXERIL) 5 MG tablet, Take 1 to 2 tabs up to 3 times daily for muscle spasm (Patient not taking: Reported on 03/23/2023), Disp: 60 tablet, Rfl: 1   doravirine (PIFELTRO) 100 MG TABS tablet, Take 1 tablet (100 mg total) by mouth daily., Disp: 30 tablet, Rfl: 11   emtricitabine-tenofovir AF (DESCOVY) 200-25 MG tablet, Take 1 tablet by mouth daily., Disp: 30 tablet, Rfl: 11   imiquimod (ALDARA) 5 % cream, Apply topically 3 (three) times a week. To lesion on hand (Patient not taking: Reported on 03/23/2023), Disp: 12 each, Rfl: 2   naproxen (NAPROSYN) 500 MG tablet, Take 1 tablet (500 mg total) by mouth 2 (two) times daily with a meal. Prn pain (Patient not taking: Reported on 03/23/2023), Disp: 60 tablet, Rfl: 1   sodium fluoride (PREVIDENT 5000 PLUS) 1.1 % CREA dental cream, Brush on teeth with a toothbrush after evening mouth care. Spit out excess and do NOT rinse. (Patient not taking: Reported on 03/23/2023), Disp: 51 g, Rfl: 3   Review of Systems  Constitutional:  Negative for activity change, appetite change, chills, diaphoresis, fatigue, fever and unexpected weight change.  HENT:  Negative for congestion, rhinorrhea, sinus pressure, sneezing, sore throat and trouble swallowing.   Eyes:  Negative for photophobia and visual disturbance.  Respiratory:  Negative for cough, chest tightness, shortness of breath, wheezing and stridor.   Cardiovascular:  Negative for chest pain, palpitations and leg swelling.  Gastrointestinal:  Negative for abdominal distention, abdominal pain, anal bleeding, blood in stool, constipation, diarrhea, nausea and vomiting.  Genitourinary:  Negative for difficulty urinating, dysuria, flank pain and hematuria.  Musculoskeletal:  Positive for back pain and myalgias. Negative for arthralgias, gait problem and joint swelling.  Skin:  Negative for color change, pallor, rash and wound.   Neurological:  Negative for dizziness, tremors, weakness and light-headedness.  Hematological:  Negative for adenopathy. Does not bruise/bleed easily.  Psychiatric/Behavioral:  Negative for agitation, behavioral problems, confusion, decreased concentration, dysphoric mood and sleep disturbance.        Objective:   Physical Exam Constitutional:      Appearance: He is well-developed.  HENT:     Head: Normocephalic and atraumatic.  Eyes:     Conjunctiva/sclera: Conjunctivae normal.  Cardiovascular:     Rate and Rhythm: Normal rate and regular rhythm.  Pulmonary:     Effort: Pulmonary effort is normal. No respiratory distress.     Breath sounds: No wheezing.  Abdominal:     General: There is no distension.     Palpations: Abdomen is soft.  Musculoskeletal:        General: No tenderness. Normal range of motion.     Cervical back: Normal range of  motion and neck supple.  Skin:    General: Skin is warm and dry.     Coloration: Skin is not pale.     Findings: No erythema or rash.  Neurological:     General: No focal deficit present.     Mental Status: He is alert and oriented to person, place, and time.  Psychiatric:        Mood and Affect: Mood normal.        Behavior: Behavior normal.        Thought Content: Thought content normal.        Judgment: Judgment normal.           Assessment & Plan:   Assessment and Plan    HIV Stable on Pifeltro and Descovy. -Check viral load and CD4 count today. -Follow-up in 6 months.  Hyperlipidemia Off pitavastatin due to insurance coverage. -Consider alternative statin such as Lipitor but will not rechallenge until we have greater clarity re his MSK issues  Musculoskeletal pain Bilateral leg pain, worse with laying down and standing up. No relief with muscle relaxers. Pain localized to hip area. -Check muscle enzyme levels today. -Refer to sports medicine for further evaluation and management. -Consider physical therapy  referral. --consider plain films L spine, MRI L spine since much of this sounds like it could be sciatica  Dermatologic concern Persistent bump on heel. -Refer to dermatology for evaluation.  General Health Maintenance -Administer flu shot today. -Check if due for pneumococcal vaccine and administer if needed. -Discuss annual COVID-19 vaccination.

## 2023-03-23 NOTE — Addendum Note (Signed)
Addended by: Philippa Chester on: 03/23/2023 05:14 PM   Modules accepted: Orders

## 2023-03-24 LAB — C. TRACHOMATIS/N. GONORRHOEAE RNA
C. trachomatis RNA, TMA: NOT DETECTED
N. gonorrhoeae RNA, TMA: NOT DETECTED

## 2023-03-25 LAB — CBC WITH DIFFERENTIAL/PLATELET
Absolute Lymphocytes: 1842 {cells}/uL (ref 850–3900)
Absolute Monocytes: 810 {cells}/uL (ref 200–950)
Basophils Absolute: 42 {cells}/uL (ref 0–200)
Basophils Relative: 0.7 %
Eosinophils Absolute: 222 {cells}/uL (ref 15–500)
Eosinophils Relative: 3.7 %
HCT: 44.8 % (ref 38.5–50.0)
Hemoglobin: 14.7 g/dL (ref 13.2–17.1)
MCH: 30.5 pg (ref 27.0–33.0)
MCHC: 32.8 g/dL (ref 32.0–36.0)
MCV: 92.9 fL (ref 80.0–100.0)
MPV: 10.3 fL (ref 7.5–12.5)
Monocytes Relative: 13.5 %
Neutro Abs: 3084 {cells}/uL (ref 1500–7800)
Neutrophils Relative %: 51.4 %
Platelets: 306 10*3/uL (ref 140–400)
RBC: 4.82 10*6/uL (ref 4.20–5.80)
RDW: 12.7 % (ref 11.0–15.0)
Total Lymphocyte: 30.7 %
WBC: 6 10*3/uL (ref 3.8–10.8)

## 2023-03-25 LAB — COMPLETE METABOLIC PANEL WITH GFR
AG Ratio: 1.4 (calc) (ref 1.0–2.5)
ALT: 18 U/L (ref 9–46)
AST: 18 U/L (ref 10–35)
Albumin: 4.5 g/dL (ref 3.6–5.1)
Alkaline phosphatase (APISO): 80 U/L (ref 35–144)
BUN: 13 mg/dL (ref 7–25)
CO2: 25 mmol/L (ref 20–32)
Calcium: 9.5 mg/dL (ref 8.6–10.3)
Chloride: 105 mmol/L (ref 98–110)
Creat: 0.77 mg/dL (ref 0.70–1.30)
Globulin: 3.3 g/dL (ref 1.9–3.7)
Glucose, Bld: 91 mg/dL (ref 65–99)
Potassium: 4.1 mmol/L (ref 3.5–5.3)
Sodium: 139 mmol/L (ref 135–146)
Total Bilirubin: 0.5 mg/dL (ref 0.2–1.2)
Total Protein: 7.8 g/dL (ref 6.1–8.1)
eGFR: 104 mL/min/{1.73_m2} (ref >=60)

## 2023-03-25 LAB — LIPID PANEL
Cholesterol: 197 mg/dL (ref <200)
HDL: 51 mg/dL (ref >=40)
LDL Cholesterol (Calc): 109 mg/dL — ABNORMAL HIGH
Non-HDL Cholesterol (Calc): 146 mg/dL — ABNORMAL HIGH (ref <130)
Total CHOL/HDL Ratio: 3.9 (calc) (ref <5.0)
Triglycerides: 259 mg/dL — ABNORMAL HIGH (ref <150)

## 2023-03-25 LAB — T PALLIDUM AB: T Pallidum Abs: POSITIVE — AB

## 2023-03-25 LAB — HIV-1 RNA QUANT-NO REFLEX-BLD
HIV 1 RNA Quant: NOT DETECTED {copies}/mL
HIV-1 RNA Quant, Log: NOT DETECTED {Log}

## 2023-03-25 LAB — T-HELPER CELLS (CD4) COUNT (NOT AT ARMC)
Absolute CD4: 420 {cells}/uL — ABNORMAL LOW (ref 490–1740)
CD4 T Helper %: 21 % — ABNORMAL LOW (ref 30–61)
Total lymphocyte count: 1994 {cells}/uL (ref 850–3900)

## 2023-03-25 LAB — RPR TITER: RPR Titer: 1:1 {titer} — ABNORMAL HIGH

## 2023-03-25 LAB — RPR: RPR Ser Ql: REACTIVE — AB

## 2023-03-25 LAB — CK: Total CK: 83 U/L (ref 44–196)

## 2023-04-05 ENCOUNTER — Ambulatory Visit: Payer: BLUE CROSS/BLUE SHIELD | Admitting: Sports Medicine

## 2023-04-11 NOTE — Progress Notes (Signed)
 [name] Phone: [phone]   Assessment and Plan:     1. Chronic bilateral low back pain with bilateral sciatica 2. Bilateral hip pain - Chronic with exacerbation, initial sports medicine visit - Currently unclear etiology of 1+ year of low back pain with bilateral radicular symptoms.  Possible herniated disc - X-rays obtained in clinic.  My interpretation: No acute fracture, dislocation, or vertebral collapse.  Mild sclerosis of bilateral acetabulum - Start meloxicam  15 mg daily x2 weeks.  If still having pain after 2 weeks, complete 3rd-week of NSAID. May use remaining NSAID as needed once daily for pain control.  Do not to use additional over-the-counter NSAIDs (ibuprofen, naproxen , Advil, Aleve ) while taking prescription NSAIDs.  May use Tylenol [phone] mg 2 to 3 times a day for breakthrough pain. - Start HEP and physical therapy for bilateral hips and low back   Pertinent previous records reviewed include none  Follow Up: 6 weeks for reevaluation.  If no improvement or worsening of symptoms, would order lumbar MRI   Subjective:   I, Jason [name], am serving as a Neurosurgeon for [employer]  Chief Complaint: low back , legs, and hip pain   HPI:   04/12/2023 [name] with low back, legs, and hip pain. 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 A year ago the pain started as a dull ache but now its pain. Pain only when he lays down. Bilateral leg  pain down. He has trouble going to sleep. Advil does not help. Does endorse numbness but that has been going on for a few years with tingling. Right knee gives out sometimes when he is walking. He does endorse antalgic gait.   Relevant Historical Information: HIV  Additional pertinent review of systems negative.   Current Outpatient Medications:    cetirizine  (ZYRTEC ) 10 MG tablet, Take 1 tablet (10 mg total)  by mouth daily. Prn itching, Disp: 30 tablet, Rfl: 11   cyclobenzaprine  (FLEXERIL ) 5 MG tablet, Take 1 to 2 tabs up to 3 times daily for muscle spasm, Disp: 60 tablet, Rfl: 1   doravirine  (PIFELTRO ) 100 MG TABS tablet, Take 1 tablet (100 mg total) by mouth daily., Disp: 30 tablet, Rfl: 11   emtricitabine -tenofovir  AF (DESCOVY) 200-25 MG tablet, Take 1 tablet by mouth daily., Disp: 30 tablet, Rfl: 11   imiquimod  (ALDARA ) 5 % cream, Apply topically 3 (three) times a week. To lesion on hand, Disp: 12 each, Rfl: 2   meloxicam  (MOBIC ) 15 MG tablet, Take 1 tablet (15 mg total) by mouth daily., Disp: 30 tablet, Rfl: 0   sodium fluoride  (PREVIDENT 5000 PLUS) 1.1 % CREA dental cream, Brush on teeth with a toothbrush after evening mouth care. Spit out excess and do NOT rinse., Disp: 51 g, Rfl: 3   Objective:     Vitals:   04/12/23 1039  Pulse: 83  SpO2: 100%  Weight: 178 lb (80.7 kg)  Height: 5\' 8"  (1.727 m)      Body mass index is 27.06 kg/m.    Physical Exam:    Gen: Appears well, nad, nontoxic and pleasant Psych: Alert and oriented, appropriate mood and affect Neuro: sensation intact, strength is 5/5 in upper and lower extremities, muscle tone wnl Skin: no susupicious lesions or rashes  Back - Normal skin, Spine with normal alignment and no deformity.   No tenderness to vertebral process palpation.  Paraspinous muscles are not tender and without spasm NTTP gluteal musculature Straight leg raise negative Trendelenberg negative Piriformis Test negative Gait normal    Electronically signed by:  Marshall Skeeter [facility] 11:28 AM 04/12/23

## 2023-04-12 ENCOUNTER — Ambulatory Visit (INDEPENDENT_AMBULATORY_CARE_PROVIDER_SITE_OTHER): Payer: BC Managed Care – PPO

## 2023-04-12 ENCOUNTER — Ambulatory Visit: Payer: BLUE CROSS/BLUE SHIELD | Admitting: Sports Medicine

## 2023-04-12 VITALS — HR 83 | Ht 68.0 in | Wt 178.0 lb

## 2023-04-12 DIAGNOSIS — M5441 Lumbago with sciatica, right side: Secondary | ICD-10-CM | POA: Diagnosis not present

## 2023-04-12 DIAGNOSIS — M25552 Pain in left hip: Secondary | ICD-10-CM

## 2023-04-12 DIAGNOSIS — M5442 Lumbago with sciatica, left side: Secondary | ICD-10-CM | POA: Diagnosis not present

## 2023-04-12 DIAGNOSIS — M545 Low back pain, unspecified: Secondary | ICD-10-CM | POA: Diagnosis not present

## 2023-04-12 DIAGNOSIS — M25551 Pain in right hip: Secondary | ICD-10-CM | POA: Diagnosis not present

## 2023-04-12 DIAGNOSIS — G8929 Other chronic pain: Secondary | ICD-10-CM | POA: Diagnosis not present

## 2023-04-12 MED ORDER — MELOXICAM 15 MG PO TABS: 15.0000 mg | ORAL_TABLET | Freq: Every day | ORAL | 0 refills | Status: DC

## 2023-04-12 NOTE — Patient Instructions (Addendum)
-   Start meloxicam 15 mg daily x2 weeks.  If still having pain after 2 weeks, complete 3rd-week of NSAID. May use remaining NSAID as needed once daily for pain control.  Do not to use additional over-the-counter NSAIDs (ibuprofen, naproxen, Advil, Aleve) while taking prescription NSAIDs.  May use Tylenol [phone] mg 2 to 3 times a day for breakthrough pain. Low back HEP  PT referral  6 week follow up

## 2023-04-18 ENCOUNTER — Encounter: Payer: Self-pay | Admitting: Sports Medicine

## 2023-04-26 ENCOUNTER — Ambulatory Visit: Payer: BC Managed Care – PPO | Admitting: Physical Therapy

## 2023-05-03 ENCOUNTER — Ambulatory Visit: Payer: BC Managed Care – PPO | Admitting: Physical Therapy

## 2023-05-12 ENCOUNTER — Other Ambulatory Visit: Payer: Self-pay | Admitting: Sports Medicine

## 2023-05-12 DIAGNOSIS — G8929 Other chronic pain: Secondary | ICD-10-CM

## 2023-05-12 DIAGNOSIS — M25551 Pain in right hip: Secondary | ICD-10-CM

## 2023-05-31 ENCOUNTER — Ambulatory Visit: Payer: BC Managed Care – PPO | Admitting: Dermatology

## 2023-07-11 ENCOUNTER — Telehealth: Payer: Self-pay

## 2023-07-11 NOTE — Telephone Encounter (Signed)
 Patient called wanting sooner appointment with Dr.Van Dam. Patient stated that he recently lost his partner 2 weeks ago unexpectedly. Patient has missed doses of medications and wanted to be checked out. Patient has resumed medications. Patient rescheduled to sooner appointment with Dr.Van Jacquelin Matin. Offered counseling services at our office, patient unable to come in on Tuesdays due to work. Patient stated that he would reach out to caseworker [place] and discuss with Dr.Van Dam at next appointment.   Aprel Egelhoff Roann Chestnut, CMA

## 2023-07-19 ENCOUNTER — Other Ambulatory Visit: Payer: Self-pay

## 2023-07-19 DIAGNOSIS — B20 Human immunodeficiency virus [HIV] disease: Secondary | ICD-10-CM

## 2023-07-26 ENCOUNTER — Other Ambulatory Visit

## 2023-07-26 ENCOUNTER — Other Ambulatory Visit: Payer: Self-pay

## 2023-07-26 ENCOUNTER — Telehealth: Payer: Self-pay

## 2023-07-26 DIAGNOSIS — B20 Human immunodeficiency virus [HIV] disease: Secondary | ICD-10-CM

## 2023-07-26 NOTE — Telephone Encounter (Signed)
 Error no encounter incurred. (IT purposes)

## 2023-07-27 ENCOUNTER — Other Ambulatory Visit

## 2023-07-27 LAB — T-HELPER CELL (CD4) - (RCID CLINIC ONLY)
CD4 % Helper T Cell: 21 % — ABNORMAL LOW (ref 33–65)
CD4 T Cell Abs: 242 /uL — ABNORMAL LOW (ref 400–1790)

## 2023-07-28 LAB — HIV-1 RNA QUANT-NO REFLEX-BLD
HIV 1 RNA Quant: NOT DETECTED {copies}/mL
HIV-1 RNA Quant, Log: NOT DETECTED {Log_copies}/mL

## 2023-08-09 ENCOUNTER — Ambulatory Visit: Admitting: Infectious Disease

## 2023-08-09 DIAGNOSIS — Z7185 Encounter for immunization safety counseling: Secondary | ICD-10-CM

## 2023-08-09 DIAGNOSIS — A539 Syphilis, unspecified: Secondary | ICD-10-CM

## 2023-08-09 DIAGNOSIS — R634 Abnormal weight loss: Secondary | ICD-10-CM

## 2023-08-09 DIAGNOSIS — R85612 Low grade squamous intraepithelial lesion on cytologic smear of anus (LGSIL): Secondary | ICD-10-CM

## 2023-08-09 DIAGNOSIS — M791 Myalgia, unspecified site: Secondary | ICD-10-CM

## 2023-08-22 ENCOUNTER — Encounter: Payer: Self-pay | Admitting: Infectious Disease

## 2023-08-22 NOTE — Progress Notes (Signed)
 The 10-year ASCVD risk score (Arnett DK, et al., 2019) is: 7.2%   Values used to calculate the score:     Age: 58 years     Sex: Male     Is Non-Hispanic African American: No     Diabetic: No     Tobacco smoker: No     Systolic Blood Pressure: 129 mmHg     Is BP treated: No     HDL Cholesterol: 51 mg/dL     Total Cholesterol: 197 mg/dL  No current statin therapy, no upcoming appointment.   Aubrionna Istre, BSN, RN

## 2023-08-30 ENCOUNTER — Other Ambulatory Visit: Payer: BLUE CROSS/BLUE SHIELD

## 2023-08-31 ENCOUNTER — Ambulatory Visit

## 2023-09-13 ENCOUNTER — Ambulatory Visit: Payer: Self-pay | Admitting: Infectious Disease

## 2023-09-27 ENCOUNTER — Other Ambulatory Visit: Payer: Self-pay

## 2023-09-27 DIAGNOSIS — Z113 Encounter for screening for infections with a predominantly sexual mode of transmission: Secondary | ICD-10-CM

## 2023-09-27 DIAGNOSIS — B20 Human immunodeficiency virus [HIV] disease: Secondary | ICD-10-CM

## 2023-10-04 ENCOUNTER — Other Ambulatory Visit

## 2023-10-10 ENCOUNTER — Encounter: Admitting: Infectious Disease

## 2023-10-18 ENCOUNTER — Telehealth: Payer: Self-pay | Admitting: Family Medicine

## 2023-10-18 ENCOUNTER — Encounter: Payer: Self-pay | Admitting: Infectious Disease

## 2023-10-18 NOTE — Telephone Encounter (Signed)
 pt unconfirmed appt (per volunteer 7/23 lvm )

## 2023-10-19 ENCOUNTER — Encounter: Payer: Self-pay | Admitting: Family Medicine

## 2023-10-19 ENCOUNTER — Ambulatory Visit: Attending: Family Medicine | Admitting: Family Medicine

## 2023-10-19 VITALS — BP 134/86 | HR 102 | Wt 174.0 lb

## 2023-10-19 DIAGNOSIS — L0292 Furuncle, unspecified: Secondary | ICD-10-CM

## 2023-10-19 DIAGNOSIS — L02821 Furuncle of head [any part, except face]: Secondary | ICD-10-CM | POA: Diagnosis not present

## 2023-10-19 DIAGNOSIS — Z125 Encounter for screening for malignant neoplasm of prostate: Secondary | ICD-10-CM

## 2023-10-19 DIAGNOSIS — Z13220 Encounter for screening for lipoid disorders: Secondary | ICD-10-CM

## 2023-10-19 DIAGNOSIS — Z634 Disappearance and death of family member: Secondary | ICD-10-CM

## 2023-10-19 DIAGNOSIS — Z1211 Encounter for screening for malignant neoplasm of colon: Secondary | ICD-10-CM

## 2023-10-19 DIAGNOSIS — N454 Abscess of epididymis or testis: Secondary | ICD-10-CM

## 2023-10-19 DIAGNOSIS — Z0001 Encounter for general adult medical examination with abnormal findings: Secondary | ICD-10-CM | POA: Diagnosis not present

## 2023-10-19 DIAGNOSIS — B20 Human immunodeficiency virus [HIV] disease: Secondary | ICD-10-CM

## 2023-10-19 DIAGNOSIS — Z131 Encounter for screening for diabetes mellitus: Secondary | ICD-10-CM

## 2023-10-19 DIAGNOSIS — N5089 Other specified disorders of the male genital organs: Secondary | ICD-10-CM | POA: Diagnosis not present

## 2023-10-19 MED ORDER — DOXYCYCLINE HYCLATE 100 MG PO TABS: 100.0000 mg | ORAL_TABLET | Freq: Two times a day (BID) | ORAL | 0 refills | Status: DC

## 2023-10-19 NOTE — Patient Instructions (Signed)
 VISIT SUMMARY:  During today's visit, we addressed the draining knots on your testicle and scalp, your emotional distress, and your general health maintenance. We discussed your current HIV management and the importance of regular follow-ups. We also talked about the importance of routine health screenings and scheduled necessary tests.  YOUR PLAN:  -TESTICULAR ABSCESS: A testicular abscess is a collection of pus in the testicle, often caused by infection. You have been prescribed doxycycline  to treat the infection, advised to apply warm compresses to the area, and instructed to use bacitracin  on any open sores. An HSV swab was performed to rule out herpes.  -SCALP ABSCESS: A scalp abscess is a collection of pus under the skin on your scalp, usually due to infection. You have been prescribed doxycycline  to treat the infection and advised to apply warm compresses to the area.  -HIV INFECTION: HIV is a virus that attacks the immune system. You are under the care of an infectious disease specialist and have a follow-up appointment scheduled for September.  -GRIEF AND BEREAVEMENT: Grief and bereavement refer to the emotional distress experienced after the loss of a loved one. You have been referred to the [facility] for counseling to help you cope with your loss.  -GENERAL HEALTH MAINTENANCE: Routine health maintenance involves regular screenings and tests to monitor your overall health. We discussed the importance of screenings for cardiovascular disease, cholesterol, diabetes, kidney and liver function, prostate cancer, and colon cancer. A full physical examination and fasting labs have been ordered, and you have been referred for a colonoscopy. Please fast after midnight before your lab tests.  INSTRUCTIONS:  Please follow up with your infectious disease specialist in September for your HIV management. Schedule and attend the counseling sessions at the [facility].  Complete the fasting labs and physical examination as ordered, and schedule your colonoscopy. Apply warm compresses and use bacitracin  as instructed for your abscesses, and take the prescribed doxycycline .

## 2023-10-19 NOTE — Progress Notes (Signed)
 Subjective:  Patient ID: Jason [name], male    DOB: 01/01/1966  Age: 58 y.o. MRN: [id number]  CC: Annual Exam (Knott on back of head that is tender to touch  - came up two week ago /'pimple on testicle that is ozing and is painful with redness/Also patient loss his partner and would like therapy /)     Discussed the use of AI scribe software for clinical note transcription with the patient, who gave verbal consent to proceed.  History of Present Illness Jason [name] is a 58 year old male with HIV who presents with a draining knot on his testicle and a similar knot on the back of his head.  He has had the draining knots for about one and a half to two weeks, with pus drainage and tenderness. He felt sluggish last week but did not check for fever.  He is experiencing emotional distress following the loss of his partner and parents. He has not engaged in counseling but is open to it and does not wish to take medication for this.  He is regularly seeing an infectious disease specialist for HIV management, with the next appointment in September. No recent blood work has been done as he was not fasting.   A neoprene ring caused a burn-like injury on his groin, leading to open sores. He has been sexually active within the last two weeks but attributes the sores to the neoprene ring. He works in [employer] and frequently sweats, which may have contributed to the infection.    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   Bloating symptom 10/11/2017   Constipation 08/15/2022   Depression 01/18/2016   Difficulty voiding 08/15/2022   Dysphagia 07/19/2016   Facial rash 03/03/2016   Hepatitis C    HIV disease (HCC) 09/15/2022   HIV positive (HCC)    Hyperlipidemia 03/22/2023   Inguinal hernia 01/18/2017   Left elbow pain 08/15/2018   Low grade squamous intraepithelial lesion on cytologic smear of anus  (LGSIL) 09/01/2020   Malar rash 01/18/2017   Myalgia 03/22/2023   Olecranon bursitis 03/02/2021   Osteoarthritis of left knee 10/02/2019   Right knee pain 10/11/2017   Scrotal ulcer 12/05/2018   Septic olecranon bursitis, left 11/27/2020   Syphilis 08/15/2022   Thrush 03/03/2016   Vaccine counseling 04/27/2021   Vivid dream 04/28/2021   Wart of hand 07/29/2021   Weight gain 05/21/2019   Weight gain 05/21/2019   Weight loss 09/15/2022    Past Surgical History:  Procedure Laterality Date   ESOPHAGOGASTRODUODENOSCOPY (EGD) WITH PROPOFOL  N/A 09/10/2016   Procedure: ESOPHAGOGASTRODUODENOSCOPY (EGD) WITH PROPOFOL ;  Surgeon: Rosalie Kitchens, MD;  Location: [facility];  Service: Endoscopy;  Laterality: N/A;    Family History  Problem Relation Age of Onset   Kidney disease Father     Social History   Socioeconomic History   Marital status: Single    Spouse name: Not on file   Number of children: Not on file   Years of education: Not on file   Highest education level: Not on file  Occupational History   Not on file  Tobacco Use   Smoking status: Never   Smokeless tobacco: Never  Vaping Use   Vaping status: Never Used  Substance and Sexual Activity   Alcohol use: Yes    Comment: rare   Drug use: Yes    Types: Marijuana   Sexual activity:  Not Currently    Partners: Male    Comment: declined condoms  Other Topics Concern   Not on file  Social History Narrative   Not on file   Social Drivers of Health   Financial Resource Strain: Not on file  Food Insecurity: Low Risk  (07/24/2023)   Received from [facility]   Hunger Vital Sign    Within the past 12 months, you worried that your food would run out before you got money to buy more: Never true    Within the past 12 months, the food you bought just didn't last and you didn't have money to get more. : Never true  Transportation Needs: No Transportation Needs (07/24/2023)   Received from [facility]     In the past 12 months, has lack of reliable transportation kept you from medical appointments, meetings, work or from getting things needed for daily living? : No  Physical Activity: Not on file  Stress: Not on file  Social Connections: Not on file    Allergies  Allergen Reactions   Codeine Itching    Outpatient Medications Prior to Visit  Medication Sig Dispense Refill   doravirine  (PIFELTRO ) 100 MG TABS tablet Take 1 tablet (100 mg total) by mouth daily. 30 tablet 11   emtricitabine -tenofovir  AF (DESCOVY) 200-25 MG tablet Take 1 tablet by mouth daily. 30 tablet 11   cetirizine  (ZYRTEC ) 10 MG tablet Take 1 tablet (10 mg total) by mouth daily. Prn itching 30 tablet 11   cyclobenzaprine  (FLEXERIL ) 5 MG tablet Take 1 to 2 tabs up to 3 times daily for muscle spasm 60 tablet 1   imiquimod  (ALDARA ) 5 % cream Apply topically 3 (three) times a week. To lesion on hand 12 each 2   meloxicam  (MOBIC ) 15 MG tablet Take 1 tablet (15 mg total) by mouth daily. 30 tablet 0   sodium fluoride  (PREVIDENT 5000 PLUS) 1.1 % CREA dental cream Brush on teeth with a toothbrush after evening mouth care. Spit out excess and do NOT rinse. 51 g 3   No facility-administered medications prior to visit.     ROS Review of Systems  Constitutional:  Negative for activity change and appetite change.  HENT:  Negative for sinus pressure and sore throat.   Respiratory:  Negative for chest tightness, shortness of breath and wheezing.   Cardiovascular:  Negative for chest pain and palpitations.  Gastrointestinal:  Negative for abdominal distention, abdominal pain and constipation.  Genitourinary: Negative.   Musculoskeletal: Negative.   Psychiatric/Behavioral:  Negative for behavioral problems and dysphoric mood.     Objective:  BP 134/86 (BP Location: Right Arm, Patient Position: Sitting, Cuff Size: Normal)   Pulse (!) 102   Wt 174 lb (78.9 kg)   SpO2 96%   BMI 26.46 kg/m      10/19/2023   10:29 AM 04/12/2023    10:39 AM 03/23/2023    4:04 PM  BP/Weight  Systolic BP 134 -- 128  Diastolic BP 86 -- 86  Wt. (Lbs) 174 178 181  BMI 26.46 kg/m2 27.06 kg/m2 27.52 kg/m2      Physical Exam Constitutional:      Appearance: He is well-developed.  HENT:     Head: Normocephalic and atraumatic.     Right Ear: External ear normal.     Left Ear: External ear normal.  Eyes:     Conjunctiva/sclera: Conjunctivae normal.     Pupils: Pupils are equal, round, and reactive to light.  Neck:     Trachea: No tracheal deviation.  Cardiovascular:     Rate and Rhythm: Normal rate and regular rhythm.     Heart sounds: Normal heart sounds. No murmur heard. Pulmonary:     Effort: Pulmonary effort is normal. No respiratory distress.     Breath sounds: Normal breath sounds. No wheezing.  Chest:     Chest wall: No tenderness.  Abdominal:     General: Bowel sounds are normal.     Palpations: Abdomen is soft. There is no mass.     Tenderness: There is no abdominal tenderness.  Genitourinary:    Comments: Erythematous ring around scrotum with two open areas in the superior and inferior aspect.  2 abscesses palpated in scrotal sac with no drainage noted Musculoskeletal:        General: No tenderness. Normal range of motion.     Cervical back: Normal range of motion and neck supple.  Skin:    General: Skin is warm and dry.  Neurological:     Mental Status: He is alert and oriented to person, place, and time.        Latest Ref Rng & Units 03/23/2023    4:23 PM 08/15/2022   10:59 AM 08/03/2022   10:21 AM  CMP  Glucose 65 - 99 mg/dL 91  892  883   BUN 7 - 25 mg/dL 13  13  16    Creatinine 0.70 - 1.30 mg/dL 9.22  9.21  9.24   Sodium 135 - 146 mmol/L 139  140  138   Potassium 3.5 - 5.3 mmol/L 4.1  4.2  3.7   Chloride 98 - 110 mmol/L 105  106  105   CO2 20 - 32 mmol/L 25  24  23    Calcium  8.6 - 10.3 mg/dL 9.5  9.4  9.3   Total Protein 6.1 - 8.1 g/dL 7.8  6.9  7.1   Total Bilirubin 0.2 - 1.2 mg/dL 0.5  0.3   0.5   AST 10 - 35 U/L 18  15  19    ALT 9 - 46 U/L 18  16  16      Lipid Panel     Component Value Date/Time   CHOL 197 03/23/2023 1623   TRIG 259 (H) 03/23/2023 1623   HDL 51 03/23/2023 1623   CHOLHDL 3.9 03/23/2023 1623   VLDL 16 11/03/2016 0930   LDLCALC 109 (H) 03/23/2023 1623    CBC    Component Value Date/Time   WBC 6.0 03/23/2023 1623   RBC 4.82 03/23/2023 1623   HGB 14.7 03/23/2023 1623   HGB 15.3 07/14/2021 1121   HCT 44.8 03/23/2023 1623   HCT 46.3 07/14/2021 1121   PLT 306 03/23/2023 1623   PLT 270 07/14/2021 1121   MCV 92.9 03/23/2023 1623   MCV 94 07/14/2021 1121   MCH 30.5 03/23/2023 1623   MCHC 32.8 03/23/2023 1623   RDW 12.7 03/23/2023 1623   RDW 12.7 07/14/2021 1121   LYMPHSABS 1,322 08/03/2022 1021   LYMPHSABS 1.4 07/14/2021 1121   MONOABS 1,012 (H) 11/03/2016 0930   EOSABS 222 03/23/2023 1623   EOSABS 0.2 07/14/2021 1121   BASOSABS 42 03/23/2023 1623   BASOSABS 0.0 07/14/2021 1121    Lab Results  Component Value Date   HGBA1C 5.3 08/07/2017       Assessment & Plan Annual visit for general medical exam with abnormal findings Discussed importance of screenings for cardiovascular disease, cholesterol, diabetes, kidney and liver function, prostate  cancer, and colon cancer. - Ordered full physical examination. - Ordered fasting labs for cholesterol, kidney function, liver function, and diabetes. - Referred for colonoscopy. - Advised fasting after midnight before lab tests.  Testicular Abscess/scrotal ulcer Presence of pus and swelling indicates abscess. Differential includes boil or herpes, but pus supports abscess diagnosis. - Prescribed doxycycline  for infection. - Advised warm compress application. - Instructed use of bacitracin  on open areas. - Performed HSV swab to rule out herpes.  Furuncle Furuncle of scalp Swelling and scab consistent with abscess. - Prescribed doxycycline  for infection. - Advised warm compress  application.  HIV Infection Under care of infectious disease specialist. Missed recent appointment, follow-up scheduled in September.  Grief and Bereavement Experiencing grief after loss of partner and parents. Seeks counseling, declines medication. - [facility].  Screening for colon cancer Colonoscopy ordered  Screening for prostate cancer PSA ordered  Screening for diabetes A1c ordered  Screening for lipid for cardiovascular disease Lipid panel ordered  Meds ordered this encounter  Medications   doxycycline  (VIBRA -TABS) 100 MG tablet    Sig: Take 1 tablet (100 mg total) by mouth 2 (two) times daily.    Dispense:  14 tablet    Refill:  0    Follow-up: Return in about 1 year (around 10/18/2024) for CPE/ Preventive Health Exam.       Corrina Sabin, MD, FAAFP. [facility] [place], [place] [phone]   10/19/2023, 11:24 AM

## 2023-10-22 LAB — HERPES SIMPLEX VIRUS CULTURE

## 2023-10-23 ENCOUNTER — Ambulatory Visit: Payer: Self-pay | Admitting: Family Medicine

## 2023-10-23 ENCOUNTER — Encounter: Admitting: Internal Medicine

## 2023-10-25 ENCOUNTER — Telehealth: Payer: Self-pay

## 2023-10-25 NOTE — Progress Notes (Signed)
 Complex Care Management Note  Care Guide Note 10/25/2023 Name: Jason [name] MRN: [id number] DOB: 15-Feb-1966  Jason [name] is a 58 y.o. year old male who sees Delbert Clam, MD for primary care. I reached out to [employer] by phone today to offer complex care management services.  [name] was given information about Complex Care Management services today including:   The Complex Care Management services include support from the care team which includes your Nurse Care Manager, Clinical Social Worker, or Pharmacist.  The Complex Care Management team is here to help remove barriers to the health concerns and goals most important to you. Complex Care Management services are voluntary, and the patient may decline or stop services at any time by request to their care team member.   Complex Care Management Consent Status: Patient agreed to services and verbal consent obtained.   Follow up plan:  Telephone appointment with complex care management team member scheduled for:  11/01/23 at 10:30 a.m.   Encounter Outcome:  Patient Scheduled  Dreama [facility]  [facility], [facility] Health Care Management Assistant Direct Dial: [phone]  Fax: [phone]

## 2023-10-25 NOTE — Progress Notes (Signed)
 Complex Care Management Note Care Guide Note  10/25/2023 Name: Jason [name] MRN: [id number] DOB: 1965-08-24   Complex Care Management Outreach Attempts: An unsuccessful telephone outreach was attempted today to offer the patient information about available complex care management services.  Follow Up Plan:  Additional outreach attempts will be made to offer the patient complex care management information and services.   Encounter Outcome:  No Answer  Dreama [facility]wood [facility]  [facility], [facility] Health Care Management Assistant Direct Dial: [phone]  Fax: [phone]

## 2023-10-28 ENCOUNTER — Encounter: Payer: Self-pay | Admitting: Family Medicine

## 2023-10-30 ENCOUNTER — Other Ambulatory Visit: Payer: Self-pay | Admitting: Family Medicine

## 2023-10-30 MED ORDER — DOXYCYCLINE HYCLATE 100 MG PO TABS: 100.0000 mg | ORAL_TABLET | Freq: Two times a day (BID) | ORAL | 0 refills | Status: DC

## 2023-11-01 ENCOUNTER — Other Ambulatory Visit: Payer: Self-pay | Admitting: Licensed Clinical Social Worker

## 2023-11-01 ENCOUNTER — Other Ambulatory Visit: Payer: Self-pay

## 2023-11-01 NOTE — Patient Outreach (Signed)
 Complex Care Management   Visit Note  11/01/2023  Name:  Jason [name] MRN: [id number] DOB: Nov 19, 1965  Situation: Referral received for Complex Care Management related to grief issues faced I obtained verbal consent from Patient.  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   Bloating symptom 10/11/2017   Constipation 08/15/2022   Depression 01/18/2016   Difficulty voiding 08/15/2022   Dysphagia 07/19/2016   Facial rash 03/03/2016   Hepatitis C    HIV disease (HCC) 09/15/2022   HIV positive (HCC)    Hyperlipidemia 03/22/2023   Inguinal hernia 01/18/2017   Left elbow pain 08/15/2018   Low grade squamous intraepithelial lesion on cytologic smear of anus (LGSIL) 09/01/2020   Malar rash 01/18/2017   Myalgia 03/22/2023   Olecranon bursitis 03/02/2021   Osteoarthritis of left knee 10/02/2019   Right knee pain 10/11/2017   Scrotal ulcer 12/05/2018   Septic olecranon bursitis, left 11/27/2020   Syphilis 08/15/2022   Thrush 03/03/2016   Vaccine counseling 04/27/2021   Vivid dream 04/28/2021   Wart of hand 07/29/2021   Weight gain 05/21/2019   Weight gain 05/21/2019   Weight loss 09/15/2022    Assessment: Patient Reported Symptoms:  Cognitive Cognitive Status: Alert and oriented to person, place, and time, Difficulties with attention and concentration Cognitive/Intellectual Conditions Management [RPT]: None reported or documented in medical history or problem list   Health Maintenance Behaviors: Annual physical exam, Hobbies, Stress management Health Facilitated by: Stress management, Rest  Neurological Neurological Review of Symptoms: Weakness, Dizziness Neurological Management Strategies: Adequate rest, Coping strategies  HEENT HEENT Symptoms Reported: Sudden change or loss of vision (has glasses to wear) HEENT Management Strategies:  Coping strategies, Adequate rest    Cardiovascular  No issues noted by client    Respiratory Respiratory Symptoms Reported: Wheezing (wheezes sometimes when he sleeps) Respiratory Management Strategies: Adequate rest, Coping strategies  Endocrine Endocrine Symptoms Reported: Weakness or fatigue, Blurry vision, Increased urination    Gastrointestinal Gastrointestinal Symptoms Reported: Change in appetite Gastrointestinal Management Strategies: Adequate rest, Coping strategies    Genitourinary Genitourinary Symptoms Reported: Frequency Additional Genitourinary Details: going to bathroom more often Genitourinary Management Strategies: Adequate rest, Coping strategies  Integumentary Integumentary Symptoms Reported: No symptoms reported Skin Management Strategies: Adequate rest, Coping strategies  Musculoskeletal Musculoskelatal Symptoms Reviewed: Weakness, Joint pain Musculoskeletal Management Strategies: Adequate rest      Psychosocial Psychosocial Symptoms Reported: Anxiety - if selected complete GAD, Sadness - if selected complete PHQ 2-9, Depression - if selected complete PHQ 2-9 Behavioral Management Strategies: Coping strategies, Adequate rest Major Change/Loss/Stressor/Fears (CP): Medical condition, self Techniques to Cope with Loss/Stress/Change: Counseling, Medication  His partner died in 07/13/2023. His parents died within the past year; he is facing grief issues and facing grief symptoms       11/01/2023   10:22 AM  Depression screen PHQ 2/9  Decreased Interest 1  Down, Depressed, Hopeless 1  PHQ - 2 Score 2  Altered sleeping 2  Tired, decreased energy 1  Change in appetite 1  Feeling bad or failure about yourself  1  Trouble concentrating 1  Moving slowly or fidgety/restless 1  Suicidal thoughts 0  PHQ-9 Score 9  Difficult doing work/chores Somewhat difficult    Vitals:  BP in normal range per client information   Medications Reviewed Today     Reviewed by  Frances,  Ozell RAMAN, LCSW (Social Worker) on 11/01/23 at 1003  Med List Status: <None>   Medication Order Taking? Sig Documenting Provider Last Dose Status Informant  cetirizine  (ZYRTEC ) 10 MG tablet [id number] Not taking  Take 1 tablet (10 mg total) by mouth daily. Prn itching  Patient not taking: Reported on 11/01/2023   Danton Jon HERO, PA-C  Active   cyclobenzaprine  (FLEXERIL ) 5 MG tablet [id number] Not taking  Take 1 to 2 tabs up to 3 times daily for muscle spasm  Patient not taking: Reported on 11/01/2023   Danton Jon HERO, PA-C  Active   doravirine  (PIFELTRO ) 100 MG TABS tablet [id number] Yes Take 1 tablet (100 mg total) by mouth daily. Fleeta Rothman, Jomarie SAILOR, MD  Active   doxycycline  (VIBRA -TABS) 100 MG tablet [id number] Yes Take 1 tablet (100 mg total) by mouth 2 (two) times daily. Newlin, Enobong, MD  Active   emtricitabine -tenofovir  AF (DESCOVY) 200-25 MG tablet [id number] Yes Take 1 tablet by mouth daily. Fleeta Rothman, Jomarie SAILOR, MD  Active   imiquimod  (ALDARA ) 5 % cream [id number] Not taking  Apply topically 3 (three) times a week. To lesion on hand  Patient not taking: Reported on 11/01/2023   Fleeta Rothman, Jomarie SAILOR, MD  Active   meloxicam  (MOBIC ) 15 MG tablet [id number] Not taking  Take 1 tablet (15 mg total) by mouth daily.  Patient not taking: Reported on 11/01/2023   Leonce Katz, DO  Active   sodium fluoride  (PREVIDENT 5000 PLUS) 1.1 % CREA dental cream [id number] Not taking  Brush on teeth with a toothbrush after evening mouth care. Spit out excess and do NOT rinse.  Patient not taking: Reported on 11/01/2023   Bonnell Margarito DEL, DMD  Active             Recommendation:   PCP Follow-up Continue Current Plan of Care Take medications as prescribed Call local counseling agencies of choice to try to determine counseling agency where he may possibly begin to receive counseling support Call LCSW as needed for SW support Allow time for stress reduction activities of choice (music, watching  favorite sports events)  Follow Up Plan:   Telephone follow up appointment date/time:  12/12/23 at 11:00 AM   Glendia Pear  MSW, LCSW [place] Licensed Clinical Social Worker Direct Dial:  [phone] Fax:  [phone] Website:  [email or web address]

## 2023-11-01 NOTE — Patient Instructions (Signed)
 Visit Information  Thank you for taking time to visit with me today. Please don't hesitate to contact me if I can be of assistance to you before our next scheduled appointment.  Our next appointment is by telephone on 12/12/23 at 11:00 AM   Please call the care guide team at [phone] if you need to cancel or reschedule your appointment.   Following is a copy of your care plan:   Goals Addressed             This Visit's Progress    VBCI Social Work Care Plan       Problems:  Grief issues faced (his partner died in 2023/07/15.  Both of his parents are now deceased.) Pain issues: joint pain Sleeping difficulty Substance use issues : client said he is trying not to use substances Social isolation possibility  CSW Clinical Goal(s):   Over the next 30 days the Patient will attend all scheduled medical appointments as evidenced by patient report and care team review of appointment completion in electronic MEDICAL RECORD NUMBER .            Over the next 30 days, client will contact counseling agencies of choice in [place], [place] to determine counseling agency locally where he may possibly go for counseling support AEB patient report of contact with counseling agencies of choice  Interventions:  Spoke with client about his current needs and status            Client said his partner died in 2023-07-15 and this has been difficult for him to face. He also said both of his parents have died in past year and so he also has grief related to death of his parents.            LCSW informed Jason [name] of [facility] on [address] in [place], [place] . Encouraged Jason [name] to call that agency to discuss his counseling needs.             LCSW also informed Jason [name] of [facility] on [address] in [place], [place]. Encouraged Jason [name] to call that agency to discuss his counseling needs             Discussed sleeping challenges. He said he is not  sleeping very well. He said he has increased appetite. He spoke of joint pain issues             He and LCSW spoke of client medical care at [facility] in [place], [place].  He sees. Dr. Newlin as PCP . He sees Dr Fleeta Rothman as HIV medical doctor.  He said that these two doctors are main physicians he sees            Completed assessment questions with client. Completed GAD-7. Completed PHQ 2/9.             Jason [name] said he is having more difficulty with concentration issues. It is hard for him to focus at this time. He works in [employer].  He said he works a good amount of hours per week at his job             Discussed transport . He has a truck to drive to appointments and to work as needed            Discussed medication procurement             Provided counseling support  Discussed stress reduction activities like listening to music, watching sports on TV. Client said he is a big sports fan             Encouraged Jason [name] to call LCSW as needed for SW support at [phone]            Client was appreciative of call from LCSW today             Patient Goals/Self-Care Activities:  Attend scheduled medical appointments            Take medications as prescribed             Call LCSW as needed for SW support             Allow time for rest and relaxation. Allow time for stress reduction events like watching sports on TV, listening to music of choice  Plan:   Telephone follow up appointment with care management team member scheduled for:  12/12/23 at 11:00 AM        Please go to [facility] [address] [place] [phone]) if you are experiencing a Mental Health or Behavioral Health Crisis or need someone to talk to.  The patient verbalized understanding of instructions, educational materials, and care plan provided today and DECLINED offer to receive copy of patient instructions, educational materials, and care plan.    Jason [name]  MSW, LCSW [place] Licensed Clinical Social Worker Direct Dial:  [phone] Fax:  [phone] Website:  [email or web address]

## 2023-11-15 ENCOUNTER — Other Ambulatory Visit (HOSPITAL_COMMUNITY)
Admission: RE | Admit: 2023-11-15 | Discharge: 2023-11-15 | Disposition: A | Discharge status: Home / Self Care | Source: Ambulatory Visit | Attending: Infectious Disease | Admitting: Infectious Disease

## 2023-11-15 ENCOUNTER — Other Ambulatory Visit

## 2023-11-15 ENCOUNTER — Other Ambulatory Visit: Payer: Self-pay

## 2023-11-15 DIAGNOSIS — Z113 Encounter for screening for infections with a predominantly sexual mode of transmission: Secondary | ICD-10-CM

## 2023-11-15 DIAGNOSIS — B20 Human immunodeficiency virus [HIV] disease: Secondary | ICD-10-CM | POA: Insufficient documentation

## 2023-11-15 LAB — URINE CYTOLOGY ANCILLARY ONLY
Chlamydia: NEGATIVE
Comment: NEGATIVE
Comment: NORMAL
Neisseria Gonorrhea: NEGATIVE

## 2023-11-16 LAB — T-HELPER CELL (CD4) - (RCID CLINIC ONLY)
CD4 % Helper T Cell: 24 % — ABNORMAL LOW (ref 33–65)
CD4 T Cell Abs: 342 /uL — ABNORMAL LOW (ref 400–1790)

## 2023-11-17 LAB — COMPLETE METABOLIC PANEL WITHOUT GFR
AG Ratio: 1.5 (calc) (ref 1.0–2.5)
ALT: 17 U/L (ref 9–46)
AST: 19 U/L (ref 10–35)
Albumin: 4.5 g/dL (ref 3.6–5.1)
Alkaline phosphatase (APISO): 79 U/L (ref 35–144)
BUN: 16 mg/dL (ref 7–25)
CO2: 22 mmol/L (ref 20–32)
Calcium: 9.2 mg/dL (ref 8.6–10.3)
Chloride: 106 mmol/L (ref 98–110)
Creat: 0.99 mg/dL (ref 0.70–1.30)
Globulin: 3.1 g/dL (ref 1.9–3.7)
Glucose, Bld: 105 mg/dL — ABNORMAL HIGH (ref 65–99)
Potassium: 3.9 mmol/L (ref 3.5–5.3)
Sodium: 137 mmol/L (ref 135–146)
Total Bilirubin: 0.4 mg/dL (ref 0.2–1.2)
Total Protein: 7.6 g/dL (ref 6.1–8.1)

## 2023-11-17 LAB — CBC WITH DIFFERENTIAL/PLATELET
Absolute Lymphocytes: 1266 {cells}/uL (ref 850–3900)
Absolute Monocytes: 712 {cells}/uL (ref 200–950)
Basophils Absolute: 32 {cells}/uL (ref 0–200)
Basophils Relative: 0.5 %
Eosinophils Absolute: 239 {cells}/uL (ref 15–500)
Eosinophils Relative: 3.8 %
HCT: 44.5 % (ref 38.5–50.0)
Hemoglobin: 14.8 g/dL (ref 13.2–17.1)
MCH: 31.3 pg (ref 27.0–33.0)
MCHC: 33.3 g/dL (ref 32.0–36.0)
MCV: 94.1 fL (ref 80.0–100.0)
MPV: 9.7 fL (ref 7.5–12.5)
Monocytes Relative: 11.3 %
Neutro Abs: 4051 {cells}/uL (ref 1500–7800)
Neutrophils Relative %: 64.3 %
Platelets: 296 Thousand/uL (ref 140–400)
RBC: 4.73 Million/uL (ref 4.20–5.80)
RDW: 13.2 % (ref 11.0–15.0)
Total Lymphocyte: 20.1 %
WBC: 6.3 Thousand/uL (ref 3.8–10.8)

## 2023-11-17 LAB — T PALLIDUM AB: T Pallidum Abs: POSITIVE — AB

## 2023-11-17 LAB — RPR: RPR Ser Ql: REACTIVE — AB

## 2023-11-17 LAB — HIV-1 RNA QUANT-NO REFLEX-BLD
HIV 1 RNA Quant: NOT DETECTED {copies}/mL
HIV-1 RNA Quant, Log: NOT DETECTED {Log_copies}/mL

## 2023-11-17 LAB — RPR TITER: RPR Titer: 1:2 {titer} — ABNORMAL HIGH

## 2023-11-28 ENCOUNTER — Encounter: Payer: Self-pay | Admitting: Infectious Disease

## 2023-11-28 DIAGNOSIS — Z1212 Encounter for screening for malignant neoplasm of rectum: Secondary | ICD-10-CM | POA: Insufficient documentation

## 2023-11-28 DIAGNOSIS — F4321 Adjustment disorder with depressed mood: Secondary | ICD-10-CM | POA: Insufficient documentation

## 2023-11-28 NOTE — Progress Notes (Unsigned)
 Subjective:  Chief complaint: follow-up for HIV disease on medications   Patient ID: Jason [name], male    DOB: [birth year], 58 y.o.   MRN: [id number]  HPI  Discussed the use of AI scribe software for clinical note transcription with the patient, who gave verbal consent to proceed.  History of Present Illness   Jason [name] is a 58 year old male with HIV who presents with sleep disturbances and musculoskeletal pain.  He experiences significant sleep disturbances, characterized by difficulty maintaining sleep throughout the night, often sleeping only ten to thirty minutes at a time. Despite having a recent good night's sleep, he continues to struggle with consistent sleep patterns.  He is dealing with significant emotional distress following the loss of his partner, mother, and father. He describes having 'good days and really bad days' and mentions feeling scared and having to reschedule appointments due to bad weeks. He is seeking someone to talk to for mental health support rather than medication.  He reports musculoskeletal pain, particularly in his knee and hip, which affects his sleep and daily activities. Knee pain sometimes prevents him from bending it, and hip pain radiates into his buttocks. He has a family history of arthritis and has been diagnosed with arthritis in his hips and a lumbar spine atnerolisthesis. He has not been taking meloxicam , which was previously prescribed for inflammation.  He has been diagnosed with HIV since 2006, with an undetectable viral load for the past seven years. His CD4 count is currently at 342, which is within a healthy range. He has not been on cholesterol medication recently due to concerns about leg pain, but stopping it made zero difference on his pain.      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   Bloating symptom 10/11/2017   Constipation 08/15/2022    Depression 01/18/2016   Difficulty voiding 08/15/2022   Dysphagia 07/19/2016   Facial rash 03/03/2016   Hepatitis C    HIV disease (HCC) 09/15/2022   HIV positive (HCC)    Hyperlipidemia 03/22/2023   Inguinal hernia 01/18/2017   Left elbow pain 08/15/2018   Low grade squamous intraepithelial lesion on cytologic smear of anus (LGSIL) 09/01/2020   Malar rash 01/18/2017   Myalgia 03/22/2023   Olecranon bursitis 03/02/2021   Osteoarthritis of left knee 10/02/2019   Right knee pain 10/11/2017   Scrotal ulcer 12/05/2018   Septic olecranon bursitis, left 11/27/2020   Syphilis 08/15/2022   Thrush 03/03/2016   Vaccine counseling 04/27/2021   Vivid dream 04/28/2021   Wart of hand 07/29/2021   Weight gain 05/21/2019   Weight gain 05/21/2019   Weight loss 09/15/2022    Past Surgical History:  Procedure Laterality Date   ESOPHAGOGASTRODUODENOSCOPY (EGD) WITH PROPOFOL  N/A 09/10/2016   Procedure: ESOPHAGOGASTRODUODENOSCOPY (EGD) WITH PROPOFOL ;  Surgeon: Rosalie Kitchens, MD;  Location: [facility] ENDOSCOPY;  Service: Endoscopy;  Laterality: N/A;    Family History  Problem Relation Age of Onset   Kidney disease Father       Social History   Socioeconomic History   Marital status: Single    Spouse name: Not on file   Number of children: Not on file   Years of education: Not on file   Highest education level: Not on file  Occupational History   Not on file  Tobacco Use   Smoking status: Never   Smokeless tobacco: Never  Vaping Use   Vaping status:  Never Used  Substance and Sexual Activity   Alcohol use: Yes    Comment: rare   Drug use: Yes    Types: Marijuana   Sexual activity: Not Currently    Partners: Male    Comment: declined condoms  Other Topics Concern   Not on file  Social History Narrative   Not on file   Social Drivers of Health   Financial Resource Strain: Not on file  Food Insecurity: No Food Insecurity (11/01/2023)   Hunger Vital Sign    Worried About Running Out  of Food in the Last Year: Never true    Ran Out of Food in the Last Year: Never true  Transportation Needs: No Transportation Needs (11/01/2023)   PRAPARE - Administrator, Civil Service (Medical): No    Lack of Transportation (Non-Medical): No  Physical Activity: Not on file  Stress: Stress Concern Present (11/01/2023)   [employer] of Occupational Health - Occupational Stress Questionnaire    Feeling of Stress: To some extent  Social Connections: Not on file    Allergies  Allergen Reactions   Codeine Itching     Current Outpatient Medications:    cetirizine  (ZYRTEC ) 10 MG tablet, Take 1 tablet (10 mg total) by mouth daily. Prn itching (Patient not taking: Reported on 11/01/2023), Disp: 30 tablet, Rfl: 11   cyclobenzaprine  (FLEXERIL ) 5 MG tablet, Take 1 to 2 tabs up to 3 times daily for muscle spasm (Patient not taking: Reported on 11/01/2023), Disp: 60 tablet, Rfl: 1   doravirine  (PIFELTRO ) 100 MG TABS tablet, Take 1 tablet (100 mg total) by mouth daily., Disp: 30 tablet, Rfl: 11   doxycycline  (VIBRA -TABS) 100 MG tablet, Take 1 tablet (100 mg total) by mouth 2 (two) times daily., Disp: 14 tablet, Rfl: 0   emtricitabine -tenofovir  AF (DESCOVY) 200-25 MG tablet, Take 1 tablet by mouth daily., Disp: 30 tablet, Rfl: 11   imiquimod  (ALDARA ) 5 % cream, Apply topically 3 (three) times a week. To lesion on hand (Patient not taking: Reported on 11/01/2023), Disp: 12 each, Rfl: 2   meloxicam  (MOBIC ) 15 MG tablet, Take 1 tablet (15 mg total) by mouth daily. (Patient not taking: Reported on 11/01/2023), Disp: 30 tablet, Rfl: 0   sodium fluoride  (PREVIDENT 5000 PLUS) 1.1 % CREA dental cream, Brush on teeth with a toothbrush after evening mouth care. Spit out excess and do NOT rinse. (Patient not taking: Reported on 11/01/2023), Disp: 51 g, Rfl: 3   Review of Systems  Constitutional:  Negative for activity change, appetite change, chills, diaphoresis, fatigue, fever and unexpected weight change.   HENT:  Negative for congestion, rhinorrhea, sinus pressure, sneezing, sore throat and trouble swallowing.   Eyes:  Negative for photophobia and visual disturbance.  Respiratory:  Negative for cough, chest tightness, shortness of breath, wheezing and stridor.   Cardiovascular:  Negative for chest pain, palpitations and leg swelling.  Gastrointestinal:  Negative for abdominal distention, abdominal pain, anal bleeding, blood in stool, constipation, diarrhea, nausea and vomiting.  Genitourinary:  Negative for difficulty urinating, dysuria, flank pain and hematuria.  Musculoskeletal:  Positive for arthralgias and myalgias. Negative for back pain, gait problem and joint swelling.  Skin:  Negative for color change, pallor, rash and wound.  Neurological:  Negative for dizziness, tremors, weakness and light-headedness.  Hematological:  Negative for adenopathy. Does not bruise/bleed easily.  Psychiatric/Behavioral:  Positive for decreased concentration, dysphoric mood and sleep disturbance. Negative for agitation, behavioral problems and confusion. The patient is nervous/anxious.  Objective:   Physical Exam Constitutional:      Appearance: He is well-developed.  HENT:     Head: Normocephalic and atraumatic.  Eyes:     Conjunctiva/sclera: Conjunctivae normal.  Cardiovascular:     Rate and Rhythm: Normal rate and regular rhythm.  Pulmonary:     Effort: Pulmonary effort is normal. No respiratory distress.     Breath sounds: No wheezing.  Abdominal:     General: There is no distension.     Palpations: Abdomen is soft.  Musculoskeletal:        General: No tenderness. Normal range of motion.     Cervical back: Normal range of motion and neck supple.  Skin:    General: Skin is warm and dry.     Coloration: Skin is not pale.     Findings: No erythema or rash.  Neurological:     General: No focal deficit present.     Mental Status: He is alert and oriented to person, place, and time.   Psychiatric:        Attention and Perception: Attention and perception normal.        Mood and Affect: Mood is depressed.        Behavior: Behavior normal.        Thought Content: Thought content normal.        Judgment: Judgment normal.           Assessment & Plan:   Assessment and Plan    Grief, depression and insomnia Experiencing grief,  with depressed mood and insomnia after loss of family members and partner. Prefers talk therapy but open to medication for sleep. Discussed trazodone  for insomnia. - Refer to mental health counseling services such as [facility]. - Prescribe trazodone  for insomnia. - Discuss cognitive behavioral therapy. - Schedule follow-up appointment in December.  Human immunodeficiency virus (HIV) disease HIV viral load undetectable, CD4 count at 342, indicating effective management. 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 Imaging shows hip arthritis and lumbar spondylolisthesis. Pain affects sleep and worsens with standing. Not taking meloxicam . Discussed yoga benefits. - Prescribe meloxicam  for pain. - Order x-ray of right knee. --rtc to Sports Medicine re knee and other issues  - Provide information for physical therapist at [facility]. - Discuss yoga for pain management and flexibility.  Hyperlipidemia Discontinued cholesterol medication due to leg pain. Emphasized anti-inflammatory benefits over cholesterol reduction. - Reinstate cholesterol medication for hyperlipidemia and anti-inflammatory benefits, lipitor 20mg   Screening for rectal cancer Due for rectal cancer screening. Discussed anal pap smear as non-invasive method. - Perform anal pap smear for rectal cancer screening.  Vaccine counseling: recommended flu shot which he deferred today

## 2023-11-29 ENCOUNTER — Other Ambulatory Visit: Payer: Self-pay

## 2023-11-29 ENCOUNTER — Encounter: Payer: Self-pay | Admitting: Infectious Disease

## 2023-11-29 ENCOUNTER — Other Ambulatory Visit (HOSPITAL_COMMUNITY)
Admission: RE | Admit: 2023-11-29 | Discharge: 2023-11-29 | Disposition: A | Discharge status: Home / Self Care | Source: Ambulatory Visit | Attending: Infectious Disease | Admitting: Infectious Disease

## 2023-11-29 ENCOUNTER — Ambulatory Visit: Admitting: Infectious Disease

## 2023-11-29 VITALS — BP 121/85 | HR 78 | Temp 98.1°F | Resp 16 | Wt 175.0 lb

## 2023-11-29 DIAGNOSIS — Z1212 Encounter for screening for malignant neoplasm of rectum: Secondary | ICD-10-CM | POA: Diagnosis present

## 2023-11-29 DIAGNOSIS — E785 Hyperlipidemia, unspecified: Secondary | ICD-10-CM | POA: Diagnosis not present

## 2023-11-29 DIAGNOSIS — M5432 Sciatica, left side: Secondary | ICD-10-CM

## 2023-11-29 DIAGNOSIS — G8929 Other chronic pain: Secondary | ICD-10-CM

## 2023-11-29 DIAGNOSIS — M4316 Spondylolisthesis, lumbar region: Secondary | ICD-10-CM

## 2023-11-29 DIAGNOSIS — G4700 Insomnia, unspecified: Secondary | ICD-10-CM

## 2023-11-29 DIAGNOSIS — F4321 Adjustment disorder with depressed mood: Secondary | ICD-10-CM | POA: Diagnosis not present

## 2023-11-29 DIAGNOSIS — M25551 Pain in right hip: Secondary | ICD-10-CM

## 2023-11-29 DIAGNOSIS — M160 Bilateral primary osteoarthritis of hip: Secondary | ICD-10-CM

## 2023-11-29 DIAGNOSIS — B20 Human immunodeficiency virus [HIV] disease: Secondary | ICD-10-CM | POA: Diagnosis not present

## 2023-11-29 DIAGNOSIS — M25561 Pain in right knee: Secondary | ICD-10-CM | POA: Diagnosis present

## 2023-11-29 DIAGNOSIS — M1712 Unilateral primary osteoarthritis, left knee: Secondary | ICD-10-CM

## 2023-11-29 DIAGNOSIS — Z7185 Encounter for immunization safety counseling: Secondary | ICD-10-CM

## 2023-11-29 MED ORDER — ATORVASTATIN CALCIUM 20 MG PO TABS
20.0000 mg | ORAL_TABLET | Freq: Every day | ORAL | 11 refills | Status: AC
Start: 2023-11-29 — End: ?

## 2023-11-29 MED ORDER — MELOXICAM 15 MG PO TABS: 15.0000 mg | ORAL_TABLET | Freq: Every day | ORAL | 3 refills | Status: DC

## 2023-11-29 MED ORDER — DORAVIRINE 100 MG PO TABS: 100.0000 mg | ORAL_TABLET | Freq: Every day | ORAL | 11 refills | Status: DC

## 2023-11-29 MED ORDER — TRAZODONE HCL 50 MG PO TABS: 50.0000 mg | ORAL_TABLET | Freq: Every day | ORAL | 11 refills | Status: AC

## 2023-11-29 MED ORDER — EMTRICITABINE-TENOFOVIR AF 200-25 MG PO TABS: 1.0000 | ORAL_TABLET | Freq: Every day | ORAL | 11 refills | Status: DC

## 2023-11-29 NOTE — Patient Instructions (Addendum)
 Mental Health Resources  988: can call or text 24/7  [facility]: Address: [address] [place], [place] [address] Open 24 hours Phone: [phone]  [facility]: Address: [address] [place], [place] [address] Phone: [phone] Appointments: [email or web address]  For PT  I recommend seeing Eoin Colleran at   [facility]  [facility]  [address] [place], [place]  [phone]

## 2023-12-04 LAB — CYTOLOGY - PAP: Diagnosis: NEGATIVE

## 2023-12-11 ENCOUNTER — Other Ambulatory Visit

## 2023-12-12 ENCOUNTER — Other Ambulatory Visit: Payer: Self-pay | Admitting: Licensed Clinical Social Worker

## 2023-12-12 NOTE — Patient Instructions (Signed)
 Visit Information  Thank you for taking time to visit with me today. Please don't hesitate to contact me if I can be of assistance to you before our next scheduled appointment.  Our next appointment is by telephone on 01/09/2024 at 2:30 PM   Please call the care guide team at [phone] if you need to cancel or reschedule your appointment.   Following is a copy of your care plan:   Goals Addressed             This Visit's Progress    VBCI Social Work Care Plan       Problems:  Grief issues faced (his partner died in 08-18-23.  Both of his parents are now deceased.) Pain issues: joint pain Sleeping difficulty Substance use issues : client said he is trying not to use substances Social isolation possibility  CSW Clinical Goal(s):   Over the next 30 days the Patient will attend all scheduled medical appointments as evidenced by patient report and care team review of appointment completion in electronic MEDICAL RECORD NUMBER .            Over the next 30 days, client will contact counseling agencies of choice in [place], [place] to determine counseling agency locally where he may possibly go for counseling support AEB patient report of contact with counseling agencies of choice  Interventions:  Spoke with client about his current needs and status            Client said his partner died in 18-Aug-2023 and this has been difficult for him to face. He also said both of his parents have died in past year and so he also has grief related to death of his parents.            LCSW informed [place] of [facility] on [address] in [place], [place] . Encouraged Bonnie to call that agency to discuss his counseling needs.             LCSW also informed Bonnie of [facility] on [address] in [place], [place]. Encouraged Bonnie to call that agency to discuss his counseling needs             Discussed sleeping challenges. He said he is not  sleeping very well. He said he has increased appetite. He spoke of joint pain issues             He and LCSW spoke of client medical care at [facility] in [place], [place].  He sees. Dr. Newlin as PCP . He sees Dr Fleeta Rothman as HIV medical doctor.  He said that these two doctors are main physicians he sees            Completed GAD-7. Completed PHQ 2/9.             Raford said he is having more difficulty with concentration issues. It is hard for him to focus at this time. He works in Company secretary.  He said he works a good amount of hours per week at his job             Provided counseling support             Discussed stress reduction activities like listening to music, watching sports on TV. Client said he is a big sports fan             Encouraged Jaevon to call LCSW as needed  for SW support at [phone]            Client was appreciative of call from LCSW today             Patient Goals/Self-Care Activities:  Attend scheduled medical appointments            Take medications as prescribed             Call LCSW as needed for SW support             Allow time for rest and relaxation. Allow time for stress reduction events like watching sports on TV, listening to music of choice  Plan:   Telephone follow up appointment with care management team member scheduled for:  01/09/2024 at 2:30 PM         Please go to [facility] [address] [place] [phone]) if you are experiencing a Mental Health or Behavioral Health Crisis or need someone to talk to.  The patient verbalized understanding of instructions, educational materials, and care plan provided today and DECLINED offer to receive copy of patient instructions, educational materials, and care plan.    Glendia Pear  MSW, LCSW [place] Licensed Clinical Social Worker Direct Dial:  [phone] Fax:  [phone] Website:  [email or web address]

## 2023-12-12 NOTE — Patient Outreach (Signed)
 Complex Care Management   Visit Note  12/12/2023  Name:  Jason [name] MRN: [id number] DOB: 08/06/1965  Situation: Referral received for Complex Care Management related to Grief issues faced; counseling needs I obtained verbal consent from Patient.  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   Anterolisthesis of lumbar spine 11/29/2023   Bloating symptom 10/11/2017   Constipation 08/15/2022   Depression 01/18/2016   Difficulty voiding 08/15/2022   Dysphagia 07/19/2016   Facial rash 03/03/2016   Grief 11/28/2023   Hepatitis C    HIV disease (HCC) 09/15/2022   HIV positive (HCC)    Hyperlipidemia 03/22/2023   Inguinal hernia 01/18/2017   Left elbow pain 08/15/2018   Low grade squamous intraepithelial lesion on cytologic smear of anus (LGSIL) 09/01/2020   Malar rash 01/18/2017   Myalgia 03/22/2023   Olecranon bursitis 03/02/2021   Osteoarthritis of left knee 10/02/2019   Right knee pain 10/11/2017   Screening for rectal cancer 11/28/2023   Scrotal ulcer 12/05/2018   Septic olecranon bursitis, left 11/27/2020   Syphilis 08/15/2022   Thrush 03/03/2016   Vaccine counseling 04/27/2021   Vivid dream 04/28/2021   Wart of hand 07/29/2021   Weight gain 05/21/2019   Weight gain 05/21/2019   Weight loss 09/15/2022    Assessment: Patient Reported Symptoms:  Cognitive Cognitive Status: Alert and oriented to person, place, and time, Difficulties with attention and concentration Cognitive/Intellectual Conditions Management [RPT]: None reported or documented in medical history or problem list   Health Maintenance Behaviors: Annual physical exam, Stress management, Hobbies Health Facilitated by: Stress management, Rest  Neurological Neurological Review of Symptoms: Weakness, Dizziness Neurological Management Strategies: Adequate rest, Coping  strategies  HEENT HEENT Symptoms Reported: Sudden change or loss of vision HEENT Management Strategies: Adequate rest, Coping strategies    Cardiovascular Cardiovascular Symptoms Reported: Fatigue, Dizziness Cardiovascular Management Strategies: Adequate rest, Coping strategies  Respiratory Respiratory Symptoms Reported: Wheezing Respiratory Management Strategies: Coping strategies  Endocrine Endocrine Symptoms Reported: Blurry vision, Increased urination, Weakness or fatigue    Gastrointestinal Gastrointestinal Symptoms Reported: Change in appetite Gastrointestinal Management Strategies: Coping strategies    Genitourinary Genitourinary Symptoms Reported: Frequency Additional Genitourinary Details: going to bathroom more often Genitourinary Management Strategies: Adequate rest, Coping strategies  Integumentary Integumentary Symptoms Reported: No symptoms reported Skin Management Strategies: Adequate rest, Coping strategies  Musculoskeletal Musculoskelatal Symptoms Reviewed: Muscle pain, Weakness, Joint pain Additional Musculoskeletal Details: joint pain ; muscle pain Musculoskeletal Management Strategies: Adequate rest      Psychosocial Psychosocial Symptoms Reported: Sadness - if selected complete PHQ 2-9, Anxiety - if selected complete GAD, Depression - if selected complete PHQ 2-9 Additional Psychological Details: Grief issues faced:  partner died in 2023-08-08 Behavioral Management Strategies: Adequate rest Major Change/Loss/Stressor/Fears (CP): Medical condition, self Techniques to Cope with Loss/Stress/Change: Counseling, Medication      12/12/2023    PHQ2-9 Depression Screening   Little interest or pleasure in doing things Several days  Feeling down, depressed, or hopeless More than half the days  PHQ-2 - Total Score 3  Trouble falling or staying asleep, or sleeping too much More than half the days  Feeling tired or having little energy Several days  Poor appetite or  overeating  Several days  Feeling bad about yourself - or that you are a failure or have let yourself or your family down Several days  Trouble concentrating on things, such as reading the newspaper or watching television Several days  Moving or speaking so slowly that other people could have noticed.  Or the opposite - being so fidgety or restless that you have been moving around a lot more than usual Several days  Thoughts that you would be better off dead, or hurting yourself in some way Not at all  PHQ2-9 Total Score 10  If you checked off any problems, how difficult have these problems made it for you to do your work, take care of things at home, or get along with other people Somewhat difficult  Depression Interventions/Treatment Counseling    Vitals:   Client did not mention any BP issues during call today with LCSW  Medications Reviewed Today   Medications were not reviewed in this encounter     Recommendation:   PCP Follow-up Continue Current Plan of Care Take medications as prescribed Utilize stress reduction activities of choice to manage anxiety and stress issues (listening to music, watching sports, spending time with friends) Call LCSW as needed for SW support  Follow Up Plan:   Telephone follow up appointment date/time:  01/09/2024 at 2:30 PM    Glendia Pear  MSW, LCSW [place] Licensed Clinical Social Worker Direct Dial:  [phone] Fax:  [phone] Website:  [email or web address]

## 2023-12-20 ENCOUNTER — Ambulatory Visit

## 2023-12-20 ENCOUNTER — Other Ambulatory Visit: Payer: Self-pay

## 2024-01-07 ENCOUNTER — Encounter (HOSPITAL_BASED_OUTPATIENT_CLINIC_OR_DEPARTMENT_OTHER): Payer: Self-pay | Admitting: Emergency Medicine

## 2024-01-07 ENCOUNTER — Other Ambulatory Visit: Payer: Self-pay

## 2024-01-07 ENCOUNTER — Emergency Department (HOSPITAL_BASED_OUTPATIENT_CLINIC_OR_DEPARTMENT_OTHER)
Admission: EM | Admit: 2024-01-07 | Discharge: 2024-01-07 | Disposition: A | Discharge status: Home / Self Care | Attending: Emergency Medicine | Admitting: Emergency Medicine

## 2024-01-07 DIAGNOSIS — S51831A Puncture wound without foreign body of right forearm, initial encounter: Secondary | ICD-10-CM | POA: Diagnosis not present

## 2024-01-07 DIAGNOSIS — W5503XA Scratched by cat, initial encounter: Secondary | ICD-10-CM | POA: Insufficient documentation

## 2024-01-07 DIAGNOSIS — Z21 Asymptomatic human immunodeficiency virus [HIV] infection status: Secondary | ICD-10-CM | POA: Insufficient documentation

## 2024-01-07 DIAGNOSIS — Z23 Encounter for immunization: Secondary | ICD-10-CM | POA: Diagnosis not present

## 2024-01-07 DIAGNOSIS — S60811A Abrasion of right wrist, initial encounter: Secondary | ICD-10-CM | POA: Diagnosis present

## 2024-01-07 MED ORDER — AMOXICILLIN-POT CLAVULANATE 875-125 MG PO TABS: 1.0000 | ORAL_TABLET | Freq: Two times a day (BID) | ORAL | 0 refills | Status: AC

## 2024-01-07 MED ORDER — SODIUM CHLORIDE 0.9 % IV SOLN
1.0000 g | Freq: Once | INTRAVENOUS | Status: AC
  Administered 2024-01-07: 1 g via INTRAVENOUS
  Filled 2024-01-07: qty 10

## 2024-01-07 MED ORDER — TETANUS-DIPHTH-ACELL PERTUSSIS 5-2-15.5 LF-MCG/0.5 IM SUSP
0.5000 mL | Freq: Once | INTRAMUSCULAR | Status: AC
  Administered 2024-01-07: 0.5 mL via INTRAMUSCULAR
  Filled 2024-01-07: qty 0.5

## 2024-01-07 NOTE — ED Provider Notes (Signed)
 [facility] Provider Note   CSN: [id number] Arrival date & time: 01/07/24  2028     Patient presents with: Animal Bite   Jason [name] is a 58 y.o. male.   58 year old male presents today for concern of cat scratch to his right wrist.  This occurred last night.  He states he accidentally rolled over onto the cat while he was asleep and the cat retaliated and scratched him on the forearm.  It got him multiple times.  The cat is unvaccinated but has never left the house.  He is unsure of his last tetanus shot.  He states today the swelling had worsened somewhat.  No fever or drainage from the area.  The history is provided by the patient. No language interpreter was used.       Prior to Admission medications   Medication Sig Start Date End Date Taking? Authorizing Provider  atorvastatin  (LIPITOR) 20 MG tablet Take 1 tablet (20 mg total) by mouth daily. 11/29/23   Fleeta Kathie Jomarie LOISE, MD  doravirine  (PIFELTRO ) 100 MG TABS tablet Take 1 tablet (100 mg total) by mouth daily. 11/29/23   Fleeta Kathie Jomarie LOISE, MD  emtricitabine -tenofovir  AF (DESCOVY) 200-25 MG tablet Take 1 tablet by mouth daily. 11/29/23   Fleeta Kathie Jomarie LOISE, MD  meloxicam  (MOBIC ) 15 MG tablet Take 1 tablet (15 mg total) by mouth daily. 11/29/23   Fleeta Kathie Jomarie LOISE, MD  traZODone  (DESYREL ) 50 MG tablet Take 1 tablet (50 mg total) by mouth at bedtime. 11/29/23   Fleeta Kathie Jomarie LOISE, MD    Allergies: Codeine and Shellfish protein-containing drug products    Review of Systems  Constitutional:  Negative for fever.  Skin:  Positive for wound.  All other systems reviewed and are negative.   Updated Vital Signs BP (!) 133/93 (BP Location: Left Arm)   Pulse 70   Temp 97.7 F (36.5 C) (Oral)   Resp 12   Ht 5' 8 (1.727 m)   Wt 77.1 kg   SpO2 98%   BMI 25.85 kg/m   Physical Exam Vitals and nursing note reviewed.  Constitutional:      General: He is not in acute  distress.    Appearance: Normal appearance. He is not ill-appearing.  HENT:     Head: Normocephalic and atraumatic.     Nose: Nose normal.  Eyes:     Conjunctiva/sclera: Conjunctivae normal.  Pulmonary:     Effort: Pulmonary effort is normal. No respiratory distress.  Musculoskeletal:        General: No deformity.  Skin:    Findings: No rash.     Comments: Multiple puncture wounds noted to right forearm.  There is an area of erythema.  No significant warmth and good range of motion in the right wrist.  Neurovascularly intact.  Compartments are soft.  See attached image for description of the wound.  Neurological:     Mental Status: He is alert.     (all labs ordered are listed, but only abnormal results are displayed) Labs Reviewed - No data to display  EKG: None  Radiology: No results found.   Procedures   Medications Ordered in the ED  cefTRIAXone  (ROCEPHIN ) 1 g in sodium chloride  0.9 % 100 mL IVPB (0 g Intravenous Stopped 01/07/24 2304)  Medical Decision Making  Medical Decision Making / ED Course   This patient presents to the ED for concern of cat scratch, this involves an extensive number of treatment options, and is a complaint that carries with it a high risk of complications and morbidity.  The differential diagnosis includes cellulitis, compartment syndrome  MDM: 58 year old male presents today with above-mentioned complaint.  Compartments are soft.  Exam otherwise reassuring.  See attached image for description.  Localized cellulitis noted.  Rocephin  given in the emergency department.  Augmentin  prescribed.  Close follow-up with PCP discussed.  Ortho referral given as well in case he is unable to follow-up with his PCP.  Strict return precautions to the emergency department discussed.  Patient voices understanding and is in agreement with plan.  No wounds requiring repair.  No hard foreign body in the puncture wounds to  indicate retained foreign body.  He denies any bites.  Tetanus shot updated.   Lab Tests: -I ordered, reviewed, and interpreted labs.   The pertinent results include:   Labs Reviewed - No data to display    EKG  EKG Interpretation Date/Time:    Ventricular Rate:    PR Interval:    QRS Duration:    QT Interval:    QTC Calculation:   R Axis:      Text Interpretation:           Medicines ordered and prescription drug management: Meds ordered this encounter  Medications   cefTRIAXone  (ROCEPHIN ) 1 g in sodium chloride  0.9 % 100 mL IVPB    Antibiotic Indication::   Cellulitis   amoxicillin -clavulanate (AUGMENTIN ) 875-125 MG tablet    Sig: Take 1 tablet by mouth every 12 (twelve) hours.    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   Anterolisthesis of lumbar spine 11/29/2023   Bloating symptom 10/11/2017   Constipation 08/15/2022   Depression 01/18/2016   Difficulty voiding 08/15/2022   Dysphagia 07/19/2016   Facial rash 03/03/2016   Grief 11/28/2023   Hepatitis C    HIV disease (HCC) 09/15/2022   HIV positive (HCC)    Hyperlipidemia 03/22/2023   Inguinal hernia 01/18/2017   Left elbow pain 08/15/2018   Low grade squamous intraepithelial lesion on cytologic smear of anus (LGSIL) 09/01/2020   Malar rash 01/18/2017   Myalgia 03/22/2023   Olecranon bursitis 03/02/2021   Osteoarthritis of left knee 10/02/2019   Right knee pain 10/11/2017   Screening for rectal cancer 11/28/2023   Scrotal ulcer 12/05/2018   Septic  olecranon bursitis, left 11/27/2020   Syphilis 08/15/2022   Thrush 03/03/2016   Vaccine counseling 04/27/2021   Vivid dream 04/28/2021   Wart of hand 07/29/2021   Weight gain 05/21/2019   Weight gain 05/21/2019   Weight loss 09/15/2022      Dispostion: Discharged in stable condition.  Return precaution discussed.  Patient voices understanding and is in agreement with the plan.   Final diagnoses:  None    ED Discharge Orders     None  Hildegard Loge, PA-C 01/07/24 2341    Elnor Jayson LABOR, DO 01/10/24 [phone]

## 2024-01-07 NOTE — ED Triage Notes (Signed)
 Patient was bitten on right wrist by his own cat last night. 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 Bite occurred in [employer]. Redness, pain, and swelling at site of bite. Patient is immunocompromised.

## 2024-01-07 NOTE — Discharge Instructions (Addendum)
 He received a dose of antibiotic in the emergency department through the IV.  I have sent additional antibiotics into the pharmacy.  Start taking these tomorrow morning.  Return for any emergent symptoms.  Follow-up with your primary care doctor in the next couple days for reevaluation.  Have also given you follow-up with our hand specialist.  If you have significant worsening please return to the emergency room without hesitation.

## 2024-01-09 ENCOUNTER — Other Ambulatory Visit: Payer: Self-pay | Admitting: Licensed Clinical Social Worker

## 2024-02-21 ENCOUNTER — Other Ambulatory Visit: Payer: Self-pay

## 2024-02-21 ENCOUNTER — Other Ambulatory Visit

## 2024-02-21 DIAGNOSIS — B20 Human immunodeficiency virus [HIV] disease: Secondary | ICD-10-CM

## 2024-02-21 DIAGNOSIS — E785 Hyperlipidemia, unspecified: Secondary | ICD-10-CM

## 2024-02-25 LAB — LIPID PANEL
Cholesterol: 135 mg/dL (ref <200)
HDL: 56 mg/dL (ref >=40)
LDL Cholesterol (Calc): 61 mg/dL
Non-HDL Cholesterol (Calc): 79 mg/dL (ref <130)
Total CHOL/HDL Ratio: 2.4 (calc) (ref <5.0)
Triglycerides: 101 mg/dL (ref <150)

## 2024-02-25 LAB — COMPLETE METABOLIC PANEL WITHOUT GFR
AG Ratio: 1.8 (calc) (ref 1.0–2.5)
ALT: 19 U/L (ref 9–46)
AST: 22 U/L (ref 10–35)
Albumin: 4.7 g/dL (ref 3.6–5.1)
Alkaline phosphatase (APISO): 69 U/L (ref 35–144)
BUN: 19 mg/dL (ref 7–25)
CO2: 26 mmol/L (ref 20–32)
Calcium: 9.6 mg/dL (ref 8.6–10.3)
Chloride: 104 mmol/L (ref 98–110)
Creat: 0.72 mg/dL (ref 0.70–1.30)
Globulin: 2.6 g/dL (ref 1.9–3.7)
Glucose, Bld: 102 mg/dL — ABNORMAL HIGH (ref 65–99)
Potassium: 4.1 mmol/L (ref 3.5–5.3)
Sodium: 137 mmol/L (ref 135–146)
Total Bilirubin: 0.5 mg/dL (ref 0.2–1.2)
Total Protein: 7.3 g/dL (ref 6.1–8.1)

## 2024-02-25 LAB — TEST AUTHORIZATION: TEST NAME:: 36126

## 2024-02-25 LAB — CBC WITH DIFFERENTIAL/PLATELET
Absolute Lymphocytes: 1350 {cells}/uL (ref 850–3900)
Absolute Monocytes: 691 {cells}/uL (ref 200–950)
Basophils Absolute: 32 {cells}/uL (ref 0–200)
Basophils Relative: 0.6 %
Eosinophils Absolute: 189 {cells}/uL (ref 15–500)
Eosinophils Relative: 3.5 %
HCT: 44.5 % (ref 39.4–51.1)
Hemoglobin: 14.9 g/dL (ref 13.2–17.1)
MCH: 31 pg (ref 27.0–33.0)
MCHC: 33.5 g/dL (ref 31.6–35.4)
MCV: 92.7 fL (ref 81.4–101.7)
MPV: 9.8 fL (ref 7.5–12.5)
Monocytes Relative: 12.8 %
Neutro Abs: 3137 {cells}/uL (ref 1500–7800)
Neutrophils Relative %: 58.1 %
Platelets: 276 Thousand/uL (ref 140–400)
RBC: 4.8 Million/uL (ref 4.20–5.80)
RDW: 12.7 % (ref 11.0–15.0)
Total Lymphocyte: 25 %
WBC: 5.4 Thousand/uL (ref 3.8–10.8)

## 2024-02-25 LAB — HIV-1 RNA QUANT-NO REFLEX-BLD
HIV 1 RNA Quant: NOT DETECTED {copies}/mL
HIV-1 RNA Quant, Log: NOT DETECTED {Log_copies}/mL

## 2024-02-25 LAB — SYPHILIS: RPR W/REFLEX TO RPR TITER AND TREPONEMAL ANTIBODIES, TRADITIONAL SCREENING AND DIAGNOSIS ALGORITHM: RPR Ser Ql: REACTIVE — AB

## 2024-02-25 LAB — RPR TITER: RPR Titer: 1:2 {titer} — ABNORMAL HIGH

## 2024-02-25 LAB — T PALLIDUM AB: T Pallidum Abs: POSITIVE — AB

## 2024-02-28 ENCOUNTER — Ambulatory Visit

## 2024-03-06 NOTE — Progress Notes (Signed)
 Subjective:  Chief complaint: follow-up for HIV disease on medications   Patient ID: Jason [name], male    DOB: 04/08/1965, 58 y.o.   MRN: [id number]  HPI  Discussed the use of AI scribe software for clinical note transcription with the patient, who gave verbal consent to proceed.  History of Present Illness   Jason [name] is a 58 year old male with HIV who presents for routine follow-up.  He is currently taking Pifeltro  and Descovy for HIV management and adheres to his medication regimen. He recalls that his HIV has been well controlled and that he has had normal CD4 counts in the past, but is unsure of the most recent CD4 result.  He is also on atorvastatin  for hyperlipidemia and meloxicam  for leg pain, which he finds helpful. Additionally, he uses trazodone  for sleep, particularly on nights when he does not have to work the next day.  He experiences increased urination and recalls being told he was prediabetic in the past. He is interested in having his A1c checked again.  He has been experiencing constipation over the past few weeks and is considering over-the-counter options like Milk of Magnesia, Miralax, or stool softeners.  He reports itching and dry skin, which he attributes to the weather. He has Zyrtec  at home, which he plans to use for relief.  He is experiencing significant emotional distress following the loss of his partner, Warren, eight months ago, as well as the loss of his parents in the past year. He describes ongoing sadness and difficulty sleeping, which he attributes to these losses.      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   Anterolisthesis of lumbar spine 11/29/2023   Bloating symptom 10/11/2017   Constipation 08/15/2022   Depression 01/18/2016   Difficulty voiding 08/15/2022   Dysphagia 07/19/2016   Facial rash 03/03/2016   Grief 11/28/2023   Hepatitis C    HIV  disease (HCC) 09/15/2022   HIV positive (HCC)    Hyperlipidemia 03/22/2023   Inguinal hernia 01/18/2017   Left elbow pain 08/15/2018   Low grade squamous intraepithelial lesion on cytologic smear of anus (LGSIL) 09/01/2020   Malar rash 01/18/2017   Myalgia 03/22/2023   Olecranon bursitis 03/02/2021   Osteoarthritis of left knee 10/02/2019   Right knee pain 10/11/2017   Screening for rectal cancer 11/28/2023   Scrotal ulcer 12/05/2018   Septic olecranon bursitis, left 11/27/2020   Syphilis 08/15/2022   Thrush 03/03/2016   Vaccine counseling 04/27/2021   Vivid dream 04/28/2021   Wart of hand 07/29/2021   Weight gain 05/21/2019   Weight gain 05/21/2019   Weight loss 09/15/2022    Past Surgical History:  Procedure Laterality Date   ESOPHAGOGASTRODUODENOSCOPY (EGD) WITH PROPOFOL  N/A 09/10/2016   Procedure: ESOPHAGOGASTRODUODENOSCOPY (EGD) WITH PROPOFOL ;  Surgeon: Rosalie Kitchens, MD;  Location: [facility] ENDOSCOPY;  Service: Endoscopy;  Laterality: N/A;    Family History  Problem Relation Age of Onset   Kidney disease Father       Social History   Socioeconomic History   Marital status: Single    Spouse name: Not on file   Number of children: Not on file   Years of education: Not on file   Highest education level: Not on file  Occupational History   Not on file  Tobacco Use   Smoking status: Never   Smokeless tobacco: Never  Vaping Use   Vaping status: Never  Used  Substance and Sexual Activity   Alcohol use: Yes    Comment: rare   Drug use: Yes    Types: Marijuana   Sexual activity: Not Currently    Partners: Male    Comment: declined condoms  Other Topics Concern   Not on file  Social History Narrative   Not on file   Social Drivers of Health   Financial Resource Strain: Not on file  Food Insecurity: No Food Insecurity (11/01/2023)   Hunger Vital Sign    Worried About Running Out of Food in the Last Year: Never true    Ran Out of Food in the Last Year: Never true   Transportation Needs: No Transportation Needs (11/01/2023)   PRAPARE - Administrator, Civil Service (Medical): No    Lack of Transportation (Non-Medical): No  Physical Activity: Not on file  Stress: Stress Concern Present (12/12/2023)   [employer] of Occupational Health - Occupational Stress Questionnaire    Feeling of Stress: To some extent  Social Connections: Not on file    Allergies  Allergen Reactions   Codeine Itching   Shellfish Protein-Containing Drug Products Nausea And Vomiting     Current Outpatient Medications:    amoxicillin -clavulanate (AUGMENTIN ) 875-125 MG tablet, Take 1 tablet by mouth every 12 (twelve) hours., Disp: 14 tablet, Rfl: 0   atorvastatin  (LIPITOR) 20 MG tablet, Take 1 tablet (20 mg total) by mouth daily., Disp: 30 tablet, Rfl: 11   doravirine  (PIFELTRO ) 100 MG TABS tablet, Take 1 tablet (100 mg total) by mouth daily., Disp: 30 tablet, Rfl: 11   emtricitabine -tenofovir  AF (DESCOVY) 200-25 MG tablet, Take 1 tablet by mouth daily., Disp: 30 tablet, Rfl: 11   meloxicam  (MOBIC ) 15 MG tablet, Take 1 tablet (15 mg total) by mouth daily., Disp: 30 tablet, Rfl: 3   traZODone  (DESYREL ) 50 MG tablet, Take 1 tablet (50 mg total) by mouth at bedtime., Disp: 30 tablet, Rfl: 11   Review of Systems  Constitutional:  Negative for activity change, appetite change, chills, diaphoresis, fatigue, fever and unexpected weight change.  HENT:  Negative for congestion, rhinorrhea, sinus pressure, sneezing, sore throat and trouble swallowing.   Eyes:  Negative for photophobia and visual disturbance.  Respiratory:  Negative for cough, chest tightness, shortness of breath, wheezing and stridor.   Cardiovascular:  Negative for chest pain, palpitations and leg swelling.  Gastrointestinal:  Negative for abdominal distention, abdominal pain, anal bleeding, blood in stool, constipation, diarrhea, nausea and vomiting.  Genitourinary:  Negative for difficulty urinating,  dysuria, flank pain and hematuria.  Musculoskeletal:  Negative for arthralgias, back pain, gait problem, joint swelling and myalgias.  Skin:  Negative for color change, pallor, rash and wound.  Neurological:  Negative for dizziness, tremors, weakness and light-headedness.  Hematological:  Negative for adenopathy. Does not bruise/bleed easily.  Psychiatric/Behavioral:  Negative for agitation, behavioral problems, confusion, decreased concentration, dysphoric mood and sleep disturbance.        Objective:   Physical Exam Constitutional:      Appearance: He is well-developed.  HENT:     Head: Normocephalic and atraumatic.  Eyes:     Conjunctiva/sclera: Conjunctivae normal.  Cardiovascular:     Rate and Rhythm: Normal rate and regular rhythm.  Pulmonary:     Effort: Pulmonary effort is normal. No respiratory distress.     Breath sounds: No wheezing.  Abdominal:     General: There is no distension.     Palpations: Abdomen is soft.  Musculoskeletal:  General: No tenderness. Normal range of motion.     Cervical back: Normal range of motion and neck supple.  Skin:    General: Skin is warm and dry.     Coloration: Skin is not pale.     Findings: No erythema or rash.  Neurological:     General: No focal deficit present.     Mental Status: He is alert and oriented to person, place, and time.  Psychiatric:        Mood and Affect: Mood normal.        Behavior: Behavior normal.        Thought Content: Thought content normal.        Judgment: Judgment normal.           Assessment & Plan:   Assessment and Plan    HIV disease HIV well-controlled with undetectable viral load. Previous CD4 count 342, no recent count available. - Continue antiretroviral therapy with Pifeltro  and Descovy.  Major depressive disorder, recurrent, moderate Ongoing depression exacerbated by personal losses. Prefers therapy over medication. - Continue trazodone  as needed for insomnia. - Encouraged  therapy with Caitlin or another provider.  Adjustment disorder with depressed mood (grief) Grief due to loss of partner and parents. Prefers in-person therapy. - Encouraged in-person therapy for grief management.  Hyperlipidemia Managed with atorvastatin . - Continue atorvastatin .  Constipation Advised against concurrent use of Milk of Magnesia with Biktarvy . - Use Milk of Magnesia, Miralax, Colace, Senna, or Docusate, avoiding Milk of Magnesia near Biktarvy . - Increase dietary fiber and hydration.  Prediabetes Increased urination. Previous prediabetes diagnosis. A1c test needed. - Ordered A1c test. - If A1c indicates diabetes, consider referral to primary care and potential metformin initiation.

## 2024-03-07 ENCOUNTER — Other Ambulatory Visit: Payer: Self-pay

## 2024-03-07 ENCOUNTER — Ambulatory Visit (INDEPENDENT_AMBULATORY_CARE_PROVIDER_SITE_OTHER): Admitting: Infectious Disease

## 2024-03-07 ENCOUNTER — Encounter: Payer: Self-pay | Admitting: Infectious Disease

## 2024-03-07 VITALS — BP 148/77 | HR 74 | Temp 97.9°F | Wt 182.0 lb

## 2024-03-07 DIAGNOSIS — L299 Pruritus, unspecified: Secondary | ICD-10-CM | POA: Diagnosis not present

## 2024-03-07 DIAGNOSIS — E785 Hyperlipidemia, unspecified: Secondary | ICD-10-CM | POA: Diagnosis not present

## 2024-03-07 DIAGNOSIS — K5900 Constipation, unspecified: Secondary | ICD-10-CM

## 2024-03-07 DIAGNOSIS — F331 Major depressive disorder, recurrent, moderate: Secondary | ICD-10-CM

## 2024-03-07 DIAGNOSIS — Z7185 Encounter for immunization safety counseling: Secondary | ICD-10-CM | POA: Diagnosis not present

## 2024-03-07 DIAGNOSIS — M4316 Spondylolisthesis, lumbar region: Secondary | ICD-10-CM

## 2024-03-07 DIAGNOSIS — R3589 Other polyuria: Secondary | ICD-10-CM

## 2024-03-07 DIAGNOSIS — R739 Hyperglycemia, unspecified: Secondary | ICD-10-CM | POA: Diagnosis not present

## 2024-03-07 DIAGNOSIS — F1911 Other psychoactive substance abuse, in remission: Secondary | ICD-10-CM | POA: Diagnosis not present

## 2024-03-07 DIAGNOSIS — F4321 Adjustment disorder with depressed mood: Secondary | ICD-10-CM

## 2024-03-07 DIAGNOSIS — B20 Human immunodeficiency virus [HIV] disease: Secondary | ICD-10-CM

## 2024-03-07 NOTE — Addendum Note (Signed)
 Addended by: FLORENE BOUCHARD D on: 03/07/2024 01:18 PM   Modules accepted: Orders

## 2024-03-08 ENCOUNTER — Telehealth: Payer: Self-pay

## 2024-03-08 LAB — HEMOGLOBIN A1C
Hgb A1c MFr Bld: 5.5 % (ref <5.7)
Mean Plasma Glucose: 111 mg/dL
eAG (mmol/L): 6.2 mmol/L

## 2024-03-08 NOTE — Telephone Encounter (Signed)
 Pharmacy Patient Advocate Encounter   Received notification from [employer] that prior authorization for PIFELTRO  is required/requested.   Insurance verification completed.   The patient is insured through [facility].   Per test claim: PA required; PA submitted to above mentioned insurance via [employer] Key/confirmation #/EOC [id number] Status is pending

## 2024-03-11 ENCOUNTER — Other Ambulatory Visit (HOSPITAL_COMMUNITY): Payer: Self-pay

## 2024-03-11 NOTE — Telephone Encounter (Signed)
 Pharmacy Patient Advocate Encounter  Received notification from [facility] that Prior Authorization for PIFELTRO  has been APPROVED from 03/08/24 to 03/08/25. Ran test claim, Copay is $0. This test claim was processed through [facility]- copay amounts may vary at other pharmacies due to pharmacy/plan contracts, or as the patient moves through the different stages of their insurance plan.   PA #/Case ID/Reference #: [id number]  Patient fills at :  [facility] [phone] [address] [place], [place] - [address] [place] [address] Phone: [phone]  Fax: [phone]

## 2024-04-10 ENCOUNTER — Other Ambulatory Visit: Payer: Self-pay | Admitting: Infectious Disease

## 2024-04-18 ENCOUNTER — Telehealth: Payer: Self-pay

## 2024-04-18 DIAGNOSIS — M25552 Pain in left hip: Secondary | ICD-10-CM

## 2024-04-18 DIAGNOSIS — G8929 Other chronic pain: Secondary | ICD-10-CM

## 2024-04-18 MED ORDER — MELOXICAM 15 MG PO TABS: 15.0000 mg | ORAL_TABLET | Freq: Every day | ORAL | 3 refills | Status: AC

## 2024-04-18 NOTE — Telephone Encounter (Signed)
 Patient called requesting refill of meloxicam .   Camila Maita, BSN, RN

## 2024-04-18 NOTE — Addendum Note (Signed)
 Addended by: FLORENE BOUCHARD D on: 04/18/2024 11:01 AM   Modules accepted: Orders

## 2024-08-28 ENCOUNTER — Other Ambulatory Visit

## 2024-09-11 ENCOUNTER — Encounter: Payer: Self-pay | Admitting: Infectious Disease
